# Patient Record
Sex: Male | Born: 1980 | Race: White | Hispanic: No | Marital: Single | Smoking: Current every day smoker
Health system: Southern US, Community
[De-identification: ages and names within clinical notes are randomized; demographics above are authoritative.]

## PROBLEM LIST (undated history)

## (undated) DIAGNOSIS — K219 Gastro-esophageal reflux disease without esophagitis: Secondary | ICD-10-CM

## (undated) DIAGNOSIS — Z9889 Other specified postprocedural states: Secondary | ICD-10-CM

## (undated) DIAGNOSIS — M545 Low back pain, unspecified: Secondary | ICD-10-CM

## (undated) DIAGNOSIS — G8929 Other chronic pain: Secondary | ICD-10-CM

## (undated) DIAGNOSIS — Z87442 Personal history of urinary calculi: Secondary | ICD-10-CM

## (undated) DIAGNOSIS — M1990 Unspecified osteoarthritis, unspecified site: Secondary | ICD-10-CM

## (undated) DIAGNOSIS — Z8489 Family history of other specified conditions: Secondary | ICD-10-CM

## (undated) DIAGNOSIS — R112 Nausea with vomiting, unspecified: Secondary | ICD-10-CM

## (undated) DIAGNOSIS — R1310 Dysphagia, unspecified: Secondary | ICD-10-CM

## (undated) HISTORY — PX: BACK SURGERY: SHX140

## (undated) HISTORY — PX: FRACTURE SURGERY: SHX138

## (undated) HISTORY — PX: ORIF RADIAL FRACTURE: SHX5113

---

## 2008-05-02 ENCOUNTER — Emergency Department (HOSPITAL_COMMUNITY): Admission: EM | Admit: 2008-05-02 | Discharge: 2008-05-02 | Payer: Self-pay | Admitting: Emergency Medicine

## 2009-06-16 ENCOUNTER — Emergency Department (HOSPITAL_COMMUNITY): Admission: EM | Admit: 2009-06-16 | Discharge: 2009-06-16 | Payer: Self-pay | Admitting: Emergency Medicine

## 2011-06-03 ENCOUNTER — Emergency Department (HOSPITAL_COMMUNITY)
Admission: EM | Admit: 2011-06-03 | Discharge: 2011-06-03 | Disposition: A | Discharge status: Home / Self Care | Payer: Medicare Other | Attending: Emergency Medicine | Admitting: Emergency Medicine

## 2011-06-03 ENCOUNTER — Encounter (HOSPITAL_COMMUNITY): Payer: Self-pay

## 2011-06-03 ENCOUNTER — Emergency Department (HOSPITAL_COMMUNITY): Payer: Medicare Other

## 2011-06-03 DIAGNOSIS — M5430 Sciatica, unspecified side: Secondary | ICD-10-CM | POA: Insufficient documentation

## 2011-06-03 DIAGNOSIS — M533 Sacrococcygeal disorders, not elsewhere classified: Secondary | ICD-10-CM | POA: Insufficient documentation

## 2011-06-03 DIAGNOSIS — M545 Low back pain, unspecified: Secondary | ICD-10-CM | POA: Insufficient documentation

## 2011-06-03 DIAGNOSIS — M79609 Pain in unspecified limb: Secondary | ICD-10-CM | POA: Insufficient documentation

## 2011-06-03 MED ORDER — OXYCODONE-ACETAMINOPHEN 5-325 MG PO TABS
1.0000 | ORAL_TABLET | Freq: Once | ORAL | Status: AC
  Administered 2011-06-03: 1 via ORAL
  Filled 2011-06-03: qty 1

## 2011-06-03 MED ORDER — METHOCARBAMOL 500 MG PO TABS
1000.0000 mg | ORAL_TABLET | Freq: Once | ORAL | Status: AC
  Administered 2011-06-03: 1000 mg via ORAL
  Filled 2011-06-03: qty 2

## 2011-06-03 MED ORDER — OXYCODONE-ACETAMINOPHEN 5-325 MG PO TABS: 1.0000 | ORAL_TABLET | ORAL | Status: AC | PRN

## 2011-06-03 MED ORDER — NAPROXEN 500 MG PO TABS: 500.0000 mg | ORAL_TABLET | Freq: Two times a day (BID) | ORAL | Status: AC

## 2011-06-03 MED ORDER — METHOCARBAMOL 500 MG PO TABS: ORAL_TABLET | ORAL | Status: DC

## 2011-06-03 NOTE — Discharge Instructions (Signed)
Sciatica Sciatica is a name for lower back pain caused by pressure on the sciatic nerve. The back pain can spread to the buttocks and back of the leg. HOME CARE   Rest as much as possible.   Only take medicine as told by your doctor.   Apply cold or heat to your back as told by your doctor.   Do not bend, lift, or sit for a long time until your pain is better.   Do not do anything that makes the condition worse.   Start normal activity when the pain is better.   Keep all follow-up visits.  GET HELP RIGHT AWAY IF:   There is more pain.   There is weakness or numbness in the legs.   You cannot control when you poop (bowel movement) or pee (urinate).  MAKE SURE YOU:   Understand these instructions.   Will watch this condition.   Will get help right away if you are not doing well or get worse.  Document Released: 01/09/2008 Document Revised: 12/12/2010 Document Reviewed: 01/09/2008 [employer] Patient Information 2012 [employer]. 

## 2011-06-03 NOTE — ED Provider Notes (Signed)
History     CSN: [id number]  Arrival date & time 06/03/11  0920   First MD Initiated Contact with Patient 06/03/11 0932      Chief Complaint  Patient presents with  . Back Pain    (Consider location/radiation/quality/duration/timing/severity/associated sxs/prior treatment) HPI Comments: Patient complains of right low back pain for 3 weeks. He states pain is increasing and now radiating into his right thigh.  Pain is worse with bending, twisting, or sitting  and improves with rest.  He states that he has had similar symptoms in the past and was told he had" pinched nerve".  He denies dysuria, incontinence of urine or feces, numbness, or weakness of the lower extremity.  Patient is a 31 y.o. male presenting with back pain. The history is provided by the patient. No language interpreter was used.  Back Pain  This is a new problem. The problem occurs constantly. The problem has not changed since onset.The pain is associated with lifting heavy objects and twisting. The pain is present in the sacro-iliac joint and lumbar spine. The quality of the pain is described as aching and shooting. The pain radiates to the right thigh. The pain is moderate. The symptoms are aggravated by bending, twisting and certain positions. The pain is the same all the time. Associated symptoms include leg pain. Pertinent negatives include no chest pain, no fever, no numbness, no abdominal pain, no abdominal swelling, no bowel incontinence, no perianal numbness, no bladder incontinence, no dysuria, no pelvic pain, no paresthesias, no paresis, no tingling and no weakness. He has tried NSAIDs and heat for the symptoms. The treatment provided mild relief.    Past Medical History  Diagnosis Date  . Back pain     Past Surgical History  Procedure Date  . Arm surgery     History reviewed. No pertinent family history.  History  Substance Use Topics  . Smoking status: Current Everyday Smoker  . Smokeless tobacco: Not  on file  . Alcohol Use: No      Review of Systems  Constitutional: Negative for fever, activity change and appetite change.  Cardiovascular: Negative for chest pain.  Gastrointestinal: Negative for abdominal pain and bowel incontinence.  Genitourinary: Negative for bladder incontinence, dysuria and pelvic pain.  Musculoskeletal: Positive for back pain.  Neurological: Negative for tingling, weakness, numbness and paresthesias.  All other systems reviewed and are negative.    Allergies  Codeine  Home Medications  No current outpatient prescriptions on file.  BP 152/90  Pulse 66  Temp(Src) 97.7 F (36.5 C) (Oral)  Resp 18  Ht 6' (1.829 m)  Wt 210 lb (95.255 kg)  BMI 28.48 kg/m2  SpO2 100%  Physical Exam  Nursing note and vitals reviewed. Constitutional: He is oriented to person, place, and time. He appears well-developed and well-nourished. No distress.  HENT:  Head: Normocephalic and atraumatic.  Neck: Normal range of motion. Neck supple.  Cardiovascular: Normal rate, regular rhythm and normal heart sounds.   Pulmonary/Chest: Effort normal and breath sounds normal. No respiratory distress.  Musculoskeletal: Normal range of motion. He exhibits tenderness. He exhibits no edema.       Lumbar back: He exhibits tenderness and bony tenderness. He exhibits normal range of motion, no swelling, no edema, no deformity, no laceration and normal pulse.       Back:  Lymphadenopathy:    He has no cervical adenopathy.  Neurological: He is alert and oriented to person, place, and time. No cranial nerve deficit or  sensory deficit. He exhibits normal muscle tone. Coordination normal.  Reflex Scores:      Patellar reflexes are 2+ on the right side and 2+ on the left side.      Achilles reflexes are 2+ on the right side and 2+ on the left side. Skin: Skin is warm and dry.    ED Course  Procedures (including critical care time)       MDM    Patient has tenderness to the  right lumbar paraspinal muscles and right SI joint.  No focal neuro deficits on exam.  No paresthesias.   He has full range of motion of bilateral lower extremities. He ambulates with a steady gait.  Symptoms are likely related to sciatica.  He agrees to close followup with Dr. Romeo Apple if the pain is not improving.  Patient / Family / Caregiver understand and agree with initial ED impression and plan with expectations set for ED visit. Pt stable in ED with no significant deterioration in condition.      Ailea Rhatigan L. [place], [place] 06/05/11 2159

## 2011-06-03 NOTE — ED Notes (Signed)
Pt reports history of back pain.  For the past 3 weeks has had lower back pain radiating down R leg.

## 2011-06-05 NOTE — ED Provider Notes (Signed)
Medical screening examination/treatment/procedure(s) were performed by non-physician practitioner and as supervising physician I was immediately available for consultation/collaboration.  Nicholes Stairs, MD 06/05/11 [phone]

## 2011-06-06 NOTE — ED Provider Notes (Signed)
Medical screening examination/treatment/procedure(s) were performed by non-physician practitioner and as supervising physician I was immediately available for consultation/collaboration.  Laymond Postle P Ngozi Alvidrez, MD 06/06/11 0711 

## 2012-11-03 ENCOUNTER — Encounter (HOSPITAL_COMMUNITY): Payer: Self-pay

## 2012-11-03 ENCOUNTER — Emergency Department (HOSPITAL_COMMUNITY): Payer: Medicare Other

## 2012-11-03 ENCOUNTER — Emergency Department (HOSPITAL_COMMUNITY)
Admission: EM | Admit: 2012-11-03 | Discharge: 2012-11-03 | Disposition: A | Discharge status: Home / Self Care | Payer: Medicare Other | Attending: Emergency Medicine | Admitting: Emergency Medicine

## 2012-11-03 DIAGNOSIS — R109 Unspecified abdominal pain: Secondary | ICD-10-CM | POA: Insufficient documentation

## 2012-11-03 DIAGNOSIS — R300 Dysuria: Secondary | ICD-10-CM | POA: Insufficient documentation

## 2012-11-03 DIAGNOSIS — F17200 Nicotine dependence, unspecified, uncomplicated: Secondary | ICD-10-CM | POA: Insufficient documentation

## 2012-11-03 DIAGNOSIS — R269 Unspecified abnormalities of gait and mobility: Secondary | ICD-10-CM | POA: Insufficient documentation

## 2012-11-03 DIAGNOSIS — M545 Low back pain, unspecified: Secondary | ICD-10-CM | POA: Insufficient documentation

## 2012-11-03 DIAGNOSIS — M549 Dorsalgia, unspecified: Secondary | ICD-10-CM

## 2012-11-03 LAB — URINALYSIS, ROUTINE W REFLEX MICROSCOPIC
Bilirubin Urine: NEGATIVE
Glucose, UA: NEGATIVE mg/dL
Hgb urine dipstick: NEGATIVE
Ketones, ur: NEGATIVE mg/dL
Protein, ur: NEGATIVE mg/dL
Urobilinogen, UA: 0.2 mg/dL (ref 0.0–1.0)

## 2012-11-03 MED ORDER — ONDANSETRON 8 MG PO TBDP
8.0000 mg | ORAL_TABLET | Freq: Once | ORAL | Status: AC
  Administered 2012-11-03: 8 mg via ORAL
  Filled 2012-11-03: qty 1

## 2012-11-03 MED ORDER — OXYCODONE-ACETAMINOPHEN 5-325 MG PO TABS
2.0000 | ORAL_TABLET | Freq: Once | ORAL | Status: AC
  Administered 2012-11-03: 2 via ORAL
  Filled 2012-11-03: qty 2

## 2012-11-03 MED ORDER — CYCLOBENZAPRINE HCL 10 MG PO TABS: 10.0000 mg | ORAL_TABLET | Freq: Three times a day (TID) | ORAL | Status: DC | PRN

## 2012-11-03 MED ORDER — OXYCODONE-ACETAMINOPHEN 5-325 MG PO TABS: 1.0000 | ORAL_TABLET | ORAL | Status: DC | PRN

## 2012-11-03 MED ORDER — NAPROXEN 500 MG PO TABS: 500.0000 mg | ORAL_TABLET | Freq: Two times a day (BID) | ORAL | Status: DC

## 2012-11-03 NOTE — ED Provider Notes (Signed)
History    CSN: [id number] Arrival date & time 11/03/12  4098  First MD Initiated Contact with Patient 11/03/12 1908     No chief complaint on file.  (Consider location/radiation/quality/duration/timing/severity/associated sxs/prior Treatment) HPI Comments: Manuel Allen is a 32 y.o. male who presents to the Emergency Department complaining of bilateral flank and low back pain for 3 days.  States the pain began in the right flank and radiates into the groin and today, noticed the pain began in the left flank as well.  Patient reports hx of kidney stones and states the pain feels similar to previous kidney stones.  He also reports urinary hesitancy today.  He does states the pain is worse with standing and improves when supine.  He denies vomiting, fever, testicle or scrotal pain or swelling, penile discharge.  He also denies numbness of the LE's, pain to his legs, or incontinence of bladder or bowel   The history is provided by the patient.   Past Medical History  Diagnosis Date  . Back pain    Past Surgical History  Procedure Laterality Date  . Arm surgery     No family history on file. History  Substance Use Topics  . Smoking status: Current Every Day Smoker  . Smokeless tobacco: Not on file  . Alcohol Use: No    Review of Systems  Constitutional: Negative for fever.  Respiratory: Negative for shortness of breath.   Cardiovascular: Negative for chest pain.  Gastrointestinal: Negative for vomiting, abdominal pain, constipation and abdominal distention.  Genitourinary: Positive for dysuria, flank pain and difficulty urinating. Negative for frequency, hematuria, decreased urine volume, discharge, penile swelling, scrotal swelling, penile pain and testicular pain.       No perineal numbness or incontinence of urine or feces  Musculoskeletal: Positive for back pain and gait problem. Negative for joint swelling.  Skin: Negative for rash.  Neurological: Negative for weakness and  numbness.  All other systems reviewed and are negative.    Allergies  Codeine  Home Medications  No current outpatient prescriptions on file. BP 176/98  Pulse 82  Temp(Src) 97.7 F (36.5 C)  Resp 17  Ht 6' (1.829 m)  Wt 220 lb (99.791 kg)  BMI 29.83 kg/m2  SpO2 98% Physical Exam  Nursing note and vitals reviewed. Constitutional: He is oriented to person, place, and time. He appears well-developed and well-nourished. No distress.  HENT:  Head: Normocephalic and atraumatic.  Neck: Normal range of motion. Neck supple.  Cardiovascular: Normal rate, regular rhythm, normal heart sounds and intact distal pulses.   No murmur heard. Pulmonary/Chest: Effort normal and breath sounds normal. No respiratory distress. He exhibits no tenderness.  Abdominal: Soft. He exhibits no distension. There is no tenderness. There is no rebound, no guarding and no CVA tenderness.  Musculoskeletal: He exhibits tenderness. He exhibits no edema.       Lumbar back: He exhibits tenderness and pain. He exhibits normal range of motion, no swelling, no deformity, no laceration and normal pulse.  ttp of the bilateral lumbar paraspinal muscles.  No spinal tenderness.  DP pulses are brisk and symmetrical.  Distal sensation intact.  Hip Flexors/Extensors are intact  Neurological: He is alert and oriented to person, place, and time. No cranial nerve deficit or sensory deficit. He exhibits normal muscle tone. Coordination and gait normal.  Reflex Scores:      Patellar reflexes are 2+ on the right side and 2+ on the left side.  Achilles reflexes are 2+ on the right side and 2+ on the left side. Skin: Skin is warm and dry.    ED Course  Procedures (including critical care time) Results for orders placed during the hospital encounter of 11/03/12  URINALYSIS, ROUTINE W REFLEX MICROSCOPIC      Result Value Range   Color, Urine YELLOW  YELLOW   APPearance CLEAR  CLEAR   Specific Gravity, Urine 1.020  1.005 -  1.030   pH 5.5  5.0 - 8.0   Glucose, UA NEGATIVE  NEGATIVE mg/dL   Hgb urine dipstick NEGATIVE  NEGATIVE   Bilirubin Urine NEGATIVE  NEGATIVE   Ketones, ur NEGATIVE  NEGATIVE mg/dL   Protein, ur NEGATIVE  NEGATIVE mg/dL   Urobilinogen, UA 0.2  0.0 - 1.0 mg/dL   Nitrite NEGATIVE  NEGATIVE   Leukocytes, UA NEGATIVE  NEGATIVE     Ct Abdomen Pelvis Wo Contrast  11/03/2012   *RADIOLOGY REPORT*  Clinical Data: Bilateral flank pain and history of renal calculi.  CT ABDOMEN AND PELVIS WITHOUT CONTRAST  Technique:  Multidetector CT imaging of the abdomen and pelvis was performed following the standard protocol without intravenous contrast.  Comparison: Prior study at [facility] dated 10/07/2005.  Findings: The kidneys are normal in appearance and show no evidence of calculi or obstruction.  The ureters and bladder are within normal limits.  Unenhanced appearance of the liver, gallbladder, pancreas, spleen, adrenal glands and bowel are unremarkable.  No abnormal fluid collections are seen.  No evidence of mass or enlarged lymph nodes.  There is suggestion of a minimal right inguinal hernia containing fat.  Bony structures are unremarkable and show minimal osteophyte formation in the thoracic and lumbar spines.  IMPRESSION: No acute findings.   Original Report Authenticated By: Irish Lack, M.D.     MDM     Patient is feeling better, VSS.  Non-toxic appearing.  Abd is soft, no significant tenderness, guarding or rebound tenderness.  Ambulates with a steady gait.  No focal neuro deficits on exam.  I have advised him to apply ice/heat to his back, f/u with his PMD, or to return here for worsening symptoms such as abd pain, fever, vomiting or hematuria.    He appears stable for discharge.  Kahliya Fraleigh L. Trisha Mangle, PA-C 11/03/12 2121

## 2012-11-03 NOTE — ED Notes (Signed)
Bil flank pain , nausea, dysuria, hx of kidney stones

## 2012-11-03 NOTE — ED Notes (Signed)
Pt reports left and right side pain for the past 3 days.  Pt denies any injury.  Pt reports the pain worsening with movement.  Pt denies any sob.

## 2012-11-04 NOTE — ED Provider Notes (Signed)
Medical screening examination/treatment/procedure(s) were performed by non-physician practitioner and as supervising physician I was immediately available for consultation/collaboration. Devoria Albe, MD, FACEP   Ward Givens, MD 11/04/12 [phone]

## 2013-07-26 ENCOUNTER — Encounter (HOSPITAL_COMMUNITY): Payer: Self-pay | Admitting: Emergency Medicine

## 2013-07-26 ENCOUNTER — Emergency Department (HOSPITAL_COMMUNITY): Payer: Medicare Other

## 2013-07-26 ENCOUNTER — Emergency Department (HOSPITAL_COMMUNITY)
Admission: EM | Admit: 2013-07-26 | Discharge: 2013-07-26 | Disposition: A | Discharge status: Home / Self Care | Payer: Medicare Other | Attending: Emergency Medicine | Admitting: Emergency Medicine

## 2013-07-26 DIAGNOSIS — Y92009 Unspecified place in unspecified non-institutional (private) residence as the place of occurrence of the external cause: Secondary | ICD-10-CM | POA: Insufficient documentation

## 2013-07-26 DIAGNOSIS — S40019A Contusion of unspecified shoulder, initial encounter: Secondary | ICD-10-CM | POA: Insufficient documentation

## 2013-07-26 DIAGNOSIS — Z9889 Other specified postprocedural states: Secondary | ICD-10-CM | POA: Insufficient documentation

## 2013-07-26 DIAGNOSIS — S161XXA Strain of muscle, fascia and tendon at neck level, initial encounter: Secondary | ICD-10-CM

## 2013-07-26 DIAGNOSIS — Y939 Activity, unspecified: Secondary | ICD-10-CM | POA: Insufficient documentation

## 2013-07-26 DIAGNOSIS — F17200 Nicotine dependence, unspecified, uncomplicated: Secondary | ICD-10-CM | POA: Insufficient documentation

## 2013-07-26 DIAGNOSIS — S139XXA Sprain of joints and ligaments of unspecified parts of neck, initial encounter: Secondary | ICD-10-CM | POA: Insufficient documentation

## 2013-07-26 DIAGNOSIS — W010XXA Fall on same level from slipping, tripping and stumbling without subsequent striking against object, initial encounter: Secondary | ICD-10-CM | POA: Insufficient documentation

## 2013-07-26 MED ORDER — IBUPROFEN 600 MG PO TABS: 600.0000 mg | ORAL_TABLET | Freq: Four times a day (QID) | ORAL | Status: DC | PRN

## 2013-07-26 MED ORDER — OXYCODONE-ACETAMINOPHEN 5-325 MG PO TABS
1.0000 | ORAL_TABLET | Freq: Once | ORAL | Status: AC
  Administered 2013-07-26: 1 via ORAL
  Filled 2013-07-26: qty 1

## 2013-07-26 MED ORDER — OXYCODONE-ACETAMINOPHEN 5-325 MG PO TABS: 1.0000 | ORAL_TABLET | ORAL | Status: DC | PRN

## 2013-07-26 NOTE — ED Notes (Signed)
Tripped on garden hose today./  C/o pain to left shoulder

## 2013-07-26 NOTE — ED Notes (Signed)
Lt shoulder pain after tripped over a water hose.

## 2013-07-26 NOTE — ED Provider Notes (Signed)
CSN: [id number]     Arrival date & time 07/26/13  1704 History   This chart was scribed for Manuel Allen Jaray Boliver by Ladona Ridgelaylor Day, ED scribe. This patient was seen in room APFT20/APFT20 and the patient's care was started at 1704.  Chief Complaint  Patient presents with  . Shoulder Injury   Patient is a 33 y.o. male presenting with shoulder injury. The history is provided by the patient. No language interpreter was used.  Shoulder Injury This is a new problem. The current episode started 3 to 5 hours ago. The problem has not changed since onset.Pertinent negatives include no chest pain, no abdominal pain and no shortness of breath. The symptoms are aggravated by bending. Nothing relieves the symptoms. He has tried nothing for the symptoms.   HPI Comments: Manuel Allen is a 33 y.o. male who presents to the Emergency Department for sudden onset, constant, left shoulder pain after he tripped on a garden hose and fell on his left side onto gravel; today about 2 1/2 hours ago at home and had sudden onset of shoulder pain; no LOC, unsure if he hit his head. He reports that pain is gone at rest and worsened by movement and palpation.   He has not taken any medicines at home prior to arrival. He denies weakness or numbness in his upper extremities, also denies dizziness, nausea, vomiting, headache.  He does have pain in his neck also since his fall.  His PCP is Dr. Janna Archondiego  Past Medical History  Diagnosis Date  . Back pain    Past Surgical History  Procedure Laterality Date  . Arm surgery     History reviewed. No pertinent family history. History  Substance Use Topics  . Smoking status: Current Every Day Smoker  . Smokeless tobacco: Not on file  . Alcohol Use: No    Review of Systems  Constitutional: Negative for fever and chills.  Respiratory: Negative for cough and shortness of breath.   Cardiovascular: Negative for chest pain.  Gastrointestinal: Negative for abdominal pain.  Musculoskeletal:  Negative for back pain.       Left shoulder pain  All other systems reviewed and are negative.   Allergies  Codeine  Home Medications   Current Outpatient Rx  Name  Route  Sig  Dispense  Refill  . ibuprofen (ADVIL,MOTRIN) 600 MG tablet   Oral   Take 1 tablet (600 mg total) by mouth every 6 (six) hours as needed.   30 tablet   0   . oxyCODONE-acetaminophen (PERCOCET/ROXICET) 5-325 MG per tablet   Oral   Take 1 tablet by mouth every 4 (four) hours as needed for severe pain.   15 tablet   0    Triage Vitals: BP 158/90  Pulse 90  Temp(Src) 98 F (36.7 C) (Oral)  Resp 18  Ht 6' (1.829 m)  Wt 220 lb (99.791 kg)  BMI 29.83 kg/m2  SpO2 100%  Physical Exam  Nursing note and vitals reviewed. Constitutional: He appears well-developed and well-nourished.  HENT:  Head: Normocephalic and atraumatic.  No hematoma  Eyes:  Disconjugate gaze in left eye which he states is normal.  Neck: Normal range of motion.  Cardiovascular:  Pulses equal bilaterally  Musculoskeletal: He exhibits tenderness.   Full radial pulse. Distal sensation intact.  Equal grip strength.   Normal light touch sensation.Tender to palpation lateral left humeral head. Left clavicle w/no obvious deformity and nontender. He also has point tenderness at c6 c7 midline. Distal LUE  sensation is normal. He can flex and extend his left wrist w/out pain.   Neurological: He is alert. He has normal strength. He displays normal reflexes. No sensory deficit.  Skin: Skin is warm and dry.  Psychiatric: He has a normal mood and affect.   ED Course  Procedures (including critical care time) DIAGNOSTIC STUDIES: Oxygen Saturation is 100% on room air, normal by my interpretation.    COORDINATION OF CARE: At 545 PM Discussed treatment plan with patient which includes left shoulder X-ray, pain medicine, apply ice. Patient agrees.   Labs Review Labs Reviewed - No data to display Imaging Review Dg Cervical Spine  Complete  07/26/2013   CLINICAL DATA:  Fall.  Posterior neck pain and left shoulder pain.  EXAM: CERVICAL SPINE  4+ VIEWS  COMPARISON:  None.  FINDINGS: Mild spurring anterior to the vertebral body column at C5-6 and C6-7. No abnormal subluxation observed. The spinous process of C7 is obscured by the patient's shoulders on the lateral projections, despite the swimmer's view. No prevertebral soft tissue swelling.  IMPRESSION: 1. No cervical spine fracture or acute subluxation is identified.   Electronically Signed   By: Herbie BaltimoreWalt  Liebkemann M.D.   On: 07/26/2013 18:31   Ct Cervical Spine Wo Contrast  07/26/2013   CLINICAL DATA:  Posterior lower neck pain after a fall today.  EXAM: CT CERVICAL SPINE WITHOUT CONTRAST  TECHNIQUE: Multidetector CT imaging of the cervical spine was performed without intravenous contrast. Multiplanar CT image reconstructions were also generated.  COMPARISON:  Radiographs dated 07/26/2013  FINDINGS: There is no fracture, subluxation, prevertebral soft tissue swelling, disc space narrowing, facet arthritis, or other abnormality.  IMPRESSION: Normal exam.   Electronically Signed   By: Geanie CooleyJim  Maxwell M.D.   On: 07/26/2013 20:30   Dg Shoulder Left  07/26/2013   CLINICAL DATA:  Fall, left shoulder pain  EXAM: LEFT SHOULDER - 2+ VIEW  COMPARISON:  None.  FINDINGS: Three views of left shoulder submitted. No acute fracture or subluxation. No radiopaque foreign body.  IMPRESSION: Negative.   Electronically Signed   By: Natasha MeadLiviu  Pop M.D.   On: 07/26/2013 17:49     EKG Interpretation None      MDM   Final diagnoses:  Shoulder contusion  Cervical strain    Patients labs and/or radiological studies were viewed and considered during the medical decision making and disposition process. Pt with fall and painful injury to left shoulder and lower c spine without xray or Ct findings of bony trauma.  He was prescribed ibuprofen,  Oxycodone.  Encouraged ice packs, can add heat tx in 2 days.   Sling provided for additional pain relief.  Encouraged f/u by pcp in 1 week if not improving. I personally performed the services described in this documentation, which was scribed in my presence. The recorded information has been reviewed and is accurate.      Manuel Allen Yosiel Thieme, PA-C 07/27/13 [phone]

## 2013-07-26 NOTE — Discharge Instructions (Signed)
Contusion A contusion is a deep bruise. Contusions happen when an injury causes bleeding under the skin. Signs of bruising include pain, puffiness (swelling), and discolored skin. The contusion may turn blue, purple, or yellow. HOME CARE   Put ice on the injured area.  Put ice in a plastic bag.  Place a towel between your skin and the bag.  Leave the ice on for 15-20 minutes, 03-04 times a day, apply ice for the next 2 days. You may add a heating pad to your areas of injury for 20 minutes several times daily starting in 3 days..  Only take medicine as told by your doctor.  Rest the injured area.  If possible, raise (elevate) the injured area to lessen puffiness. GET HELP RIGHT AWAY IF:   You have more bruising or puffiness.  You have pain that is getting worse.  Your puffiness or pain is not helped by medicine. MAKE SURE YOU:   Understand these instructions.  Will watch your condition.  Will get help right away if you are not doing well or get worse. Document Released: 09/18/2007 Document Revised: 06/24/2011 Document Reviewed: 02/04/2011 [facility] Patient Information 2014 [place], [place].  You may take the oxycodone prescribed for pain relief.  This will make you drowsy - do not drive within 4 hours of taking this medication.

## 2013-07-27 NOTE — ED Provider Notes (Signed)
Medical screening examination/treatment/procedure(s) were performed by non-physician practitioner and as supervising physician I was immediately available for consultation/collaboration.   EKG Interpretation None        Armella Stogner, MD 07/27/13 1534 

## 2013-09-28 ENCOUNTER — Emergency Department (HOSPITAL_COMMUNITY)
Admission: EM | Admit: 2013-09-28 | Discharge: 2013-09-28 | Disposition: A | Discharge status: Home / Self Care | Payer: Medicare Other | Attending: Emergency Medicine | Admitting: Emergency Medicine

## 2013-09-28 ENCOUNTER — Encounter (HOSPITAL_COMMUNITY): Payer: Self-pay | Admitting: Emergency Medicine

## 2013-09-28 ENCOUNTER — Emergency Department (HOSPITAL_COMMUNITY): Payer: Medicare Other

## 2013-09-28 DIAGNOSIS — Y9389 Activity, other specified: Secondary | ICD-10-CM | POA: Diagnosis not present

## 2013-09-28 DIAGNOSIS — X500XXA Overexertion from strenuous movement or load, initial encounter: Secondary | ICD-10-CM | POA: Insufficient documentation

## 2013-09-28 DIAGNOSIS — F17200 Nicotine dependence, unspecified, uncomplicated: Secondary | ICD-10-CM | POA: Insufficient documentation

## 2013-09-28 DIAGNOSIS — Y929 Unspecified place or not applicable: Secondary | ICD-10-CM | POA: Diagnosis not present

## 2013-09-28 DIAGNOSIS — Z9889 Other specified postprocedural states: Secondary | ICD-10-CM | POA: Diagnosis not present

## 2013-09-28 DIAGNOSIS — S63509A Unspecified sprain of unspecified wrist, initial encounter: Secondary | ICD-10-CM | POA: Diagnosis not present

## 2013-09-28 DIAGNOSIS — S59909A Unspecified injury of unspecified elbow, initial encounter: Secondary | ICD-10-CM | POA: Diagnosis present

## 2013-09-28 DIAGNOSIS — S63502A Unspecified sprain of left wrist, initial encounter: Secondary | ICD-10-CM

## 2013-09-28 MED ORDER — NAPROXEN 500 MG PO TABS: 500.0000 mg | ORAL_TABLET | Freq: Two times a day (BID) | ORAL | Status: DC

## 2013-09-28 MED ORDER — HYDROCODONE-ACETAMINOPHEN 5-325 MG PO TABS: ORAL_TABLET | ORAL | Status: DC

## 2013-09-28 NOTE — ED Notes (Signed)
Pt states twisted left wrist while picking up lawn mower. Slight swelling noted. Radial pulses present. rom limited due to pain

## 2013-09-28 NOTE — Discharge Instructions (Signed)
Wrist Pain Wrist injuries are frequent in adults and children. A sprain is an injury to the ligaments that hold your bones together. A strain is an injury to muscle or muscle cord-like structures (tendons) from stretching or pulling. Generally, when wrists are moderately tender to touch following a fall or injury, a break in the bone (fracture) may be present. Most wrist sprains or strains are better in 3 to 5 days, but complete healing may take several weeks. HOME CARE INSTRUCTIONS   Put ice on the injured area.  Put ice in a plastic bag.  Place a towel between your skin and the bag.  Leave the ice on for 15-20 minutes, 03-04 times a day, for the first 2 days.  Keep your arm raised above the level of your heart whenever possible to reduce swelling and pain.  Rest the injured area for at least 48 hours or as directed by your caregiver.  If a splint or elastic bandage has been applied, use it for as long as directed by your caregiver or until seen by a caregiver for a follow-up exam.  Only take over-the-counter or prescription medicines for pain, discomfort, or fever as directed by your caregiver.  Keep all follow-up appointments. You may need to follow up with a specialist or have follow-up X-rays. Improvement in pain level is not a guarantee that you did not fracture a bone in your wrist. The only way to determine whether or not you have a broken bone is by X-ray. SEEK IMMEDIATE MEDICAL CARE IF:   Your fingers are swollen, very red, white, or cold and blue.  Your fingers are numb or tingling.  You have increasing pain.  You have difficulty moving your fingers. MAKE SURE YOU:   Understand these instructions.  Will watch your condition.  Will get help right away if you are not doing well or get worse. Document Released: 01/09/2005 Document Revised: 06/24/2011 Document Reviewed: 05/23/2010 [employer] Patient Information 2014 [employer].  

## 2013-09-28 NOTE — ED Notes (Signed)
Pt verbalized understanding of no driving and to use caution with taking norco due to med causes drowsiness

## 2013-09-30 NOTE — ED Provider Notes (Signed)
CSN: [id number]     Arrival date & time 09/28/13  1138 History   First MD Initiated Contact with Patient 09/28/13 1211     Chief Complaint  Patient presents with  . Wrist Pain     (Consider location/radiation/quality/duration/timing/severity/associated sxs/prior Treatment) Patient is a 33 y.o. male presenting with wrist pain. The history is provided by the patient.  Wrist Pain This is a new problem. The current episode started today. The problem occurs constantly. The problem has been unchanged. Associated symptoms include arthralgias and joint swelling. Pertinent negatives include no chills, fever, nausea, neck pain, numbness, rash, vomiting or weakness. The symptoms are aggravated by twisting and bending. He has tried nothing for the symptoms. The treatment provided no relief.   Pt reports pain to left wrist after picking up a lawn mower.  States pain is worse with flexion of the wrist.  Improves at rest.  He also reports some swelling.  He denies fall, rash, fever, chills, or numbness of the hand or fingers.  Past Medical History  Diagnosis Date  . Back pain    Past Surgical History  Procedure Laterality Date  . Arm surgery     History reviewed. No pertinent family history. History  Substance Use Topics  . Smoking status: Current Every Day Smoker  . Smokeless tobacco: Not on file  . Alcohol Use: No    Review of Systems  Constitutional: Negative for fever and chills.  Gastrointestinal: Negative for nausea and vomiting.  Genitourinary: Negative for dysuria and difficulty urinating.  Musculoskeletal: Positive for arthralgias and joint swelling. Negative for neck pain.  Skin: Negative for color change, rash and wound.  Neurological: Negative for weakness and numbness.  All other systems reviewed and are negative.     Allergies  Codeine  Home Medications   Prior to Admission medications   Medication Sig Start Date End Date Taking? Authorizing Provider   HYDROcodone-acetaminophen (NORCO/VICODIN) 5-325 MG per tablet Take one-two tabs po q 4-6 hrs prn pain 09/28/13   Tammy L. Triplett, PA-C  naproxen (NAPROSYN) 500 MG tablet Take 1 tablet (500 mg total) by mouth 2 (two) times daily. 09/28/13   Tammy L. Triplett, PA-C   BP 132/83  Pulse 85  Temp(Src) 97.1 F (36.2 C) (Oral)  Resp 18  Ht 6' (1.829 m)  Wt 240 lb (108.863 kg)  BMI 32.54 kg/m2  SpO2 98% Physical Exam  Nursing note and vitals reviewed. Constitutional: He is oriented to person, place, and time. He appears well-developed and well-nourished. No distress.  HENT:  Head: Normocephalic and atraumatic.  Cardiovascular: Normal rate, regular rhythm and normal heart sounds.   No murmur heard. Pulmonary/Chest: Effort normal and breath sounds normal. No respiratory distress.  Musculoskeletal: He exhibits tenderness. He exhibits no edema.  left wrist is ttp dorsal aspect.  Radial pulse is brisk, distal sensation intact.  CR< 2 sec.  No bruising or bony deformity.  Patient has full ROM, but pain reproduced with flexion. Compartments of left arm soft.  Neurological: He is alert and oriented to person, place, and time. He exhibits normal muscle tone. Coordination normal.  Skin: Skin is warm and dry.    ED Course  Procedures (including critical care time) Labs Review Labs Reviewed - No data to display  Imaging Review Dg Wrist Complete Left  09/28/2013   CLINICAL DATA:  Pain post trauma  EXAM: LEFT WRIST - COMPLETE 3+ VIEW  COMPARISON:  None.  FINDINGS: Frontal, oblique, lateral, and ulnar deviation scaphoid images were  obtained. There is postoperative change in the distal radius with remodeling. There is no acute fracture or dislocation. 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 No erosive change.  IMPRESSION: Previous surgery in the distal radius. 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 No acute fracture or dislocation.   Electronically Signed   By:  Bretta BangWilliam  Woodruff M.D.   On: 09/28/2013 12:27    EKG Interpretation None      MDM   Final diagnoses:  Sprain of wrist, left    Pt is well appearing, likely sprain of the wrist.  No concerning sx's for septic joint.  NV intact.  Pt agrees to symptomatic tx with velcro wrist splint, vicodin and naprosyn.  Referral given to ortho in one week if needed.  Pt agrees to plan and stable for d/c     Tammy L. Trisha Mangleriplett, PA-C 09/30/13 2148

## 2013-10-01 NOTE — ED Provider Notes (Signed)
Medical screening examination/treatment/procedure(s) were performed by non-physician practitioner and as supervising physician I was immediately available for consultation/collaboration.   EKG Interpretation None        Joseph L Zammit, MD 10/01/13 1546 

## 2014-01-20 ENCOUNTER — Ambulatory Visit: Payer: Medicare Other | Admitting: Orthopedic Surgery

## 2014-02-10 ENCOUNTER — Telehealth: Payer: Self-pay | Admitting: Orthopedic Surgery

## 2014-02-10 ENCOUNTER — Ambulatory Visit (INDEPENDENT_AMBULATORY_CARE_PROVIDER_SITE_OTHER): Payer: Medicare Other | Admitting: Orthopedic Surgery

## 2014-02-10 ENCOUNTER — Encounter: Payer: Self-pay | Admitting: Orthopedic Surgery

## 2014-02-10 DIAGNOSIS — M25332 Other instability, left wrist: Secondary | ICD-10-CM

## 2014-02-10 DIAGNOSIS — S6980XA Other specified injuries of unspecified wrist, hand and finger(s), initial encounter: Secondary | ICD-10-CM | POA: Insufficient documentation

## 2014-02-10 NOTE — Telephone Encounter (Signed)
Per call back from patient/patient's mother, who is his designated party contact, request to hold on scheduling the MRI at this time, due to secondary insurance issue (Medicaid). States working on this issue with caseworker and with Medicaid, and will call when able to schedule.

## 2014-02-10 NOTE — Progress Notes (Signed)
Patient ID: Manuel MatasBrian L Holzmann, male   DOB: Jan 10, 1981, 33 y.o.   MRN: [id number] Patient ID: Manuel MatasBrian L Rinn, male   DOB: Jan 10, 1981, 33 y.o.   MRN: [id number]  Chief Complaint  Patient presents with  . Wrist Injury    Left wrist pain for 4 months/injury 09/28/2013    HPI Manuel Allen is a 33 y.o. male.  HPI 33 year old male had a distal radius fracture approximately 15 or 16 years ago had open treatment internal fixation with volar plating recovered well except over the last few years he's noticed decreased wrist motion in flexion and extension and pronation supination. However, he has been able to do most things that he wants to do. June of this year he was lifting a lawnmower motor it twisted out of his hand and he felt acute pain over his DRUJ left wrist. He went to the emergency room x-rays were obtained and they showed arthritis of the wrist and a plate with a well-healed distal radius fracture and slight ulnar positive variance to the left wrist. That is my interpretation of the x-ray and I read the report  After 6 weeks in a brace he continues to have pain and popping over the distal radial ulnar joint with pronation and supination. He did take naproxen but over several weeks' time it caused him to have GI upset. He now can't lift over 5 pounds without feeling popping and clicking in the left DRUJ. Past Medical History  Diagnosis Date  . Back pain     Past Surgical History  Procedure Laterality Date  . Arm surgery      Open treatment internal fixation left forearm radius plate    History reviewed. No pertinent family history.  Social History History  Substance Use Topics  . Smoking status: Current Every Day Smoker  . Smokeless tobacco: Not on file  . Alcohol Use: No    Allergies  Allergen Reactions  . Codeine Hives    Current Outpatient Prescriptions  Medication Sig Dispense Refill  . naproxen (NAPROSYN) 500 MG tablet Take 1 tablet (500 mg total) by mouth 2 (two) times  daily.  20 tablet  0  . HYDROcodone-acetaminophen (NORCO/VICODIN) 5-325 MG per tablet Take one-two tabs po q 4-6 hrs prn pain  20 tablet  0   No current facility-administered medications for this visit.    Review of Systems Review of Systems  Allergic/Immunologic: Positive for environmental allergies.    There were no vitals taken for this visit.  Physical Exam Physical Exam  Vitals reviewed. Constitutional: He is oriented to person, place, and time. He appears well-developed and well-nourished.  Cardiovascular:  Radial pulses normal in the left wrist  Lymphadenopathy:       Left: No epitrochlear adenopathy present.  Neurological: He is alert and oriented to person, place, and time. He has normal reflexes.  Skin: Skin is warm and dry.  Surgical incision left wrist volar aspect no complications  Psychiatric: He has a normal mood and affect. His behavior is normal. Judgment and thought content normal.   left wrist the patient has decreased extension near normal flexion normal pronation decreased supination of the left wrist. He has tenderness over the DRUJ as well as the TFCC. His grip strength remains normal. There is no swelling over the wrist joint. He does have some tenderness over the radiocarpal joint. Debility of the wrist joint itself/radiocarpal joint normal. The DRUJ does not reveal any instability but he says it only pops and clicks  when he is lifting over 5 pounds  Data Reviewed As discussed and reviewed the x-ray he has a plate on the radius slight ulnar positive variance  Assessment    DRUJ versus TFCC tear    Plan    MR arthrogram       Fuller CanadaStanley Nicola Quesnell 02/10/2014, 11:13 AM

## 2014-03-03 ENCOUNTER — Ambulatory Visit: Payer: Medicare Other | Admitting: Orthopedic Surgery

## 2014-04-16 ENCOUNTER — Encounter (HOSPITAL_COMMUNITY): Payer: Self-pay | Admitting: Emergency Medicine

## 2014-04-16 ENCOUNTER — Emergency Department (HOSPITAL_COMMUNITY)
Admission: EM | Admit: 2014-04-16 | Discharge: 2014-04-16 | Disposition: A | Discharge status: Home / Self Care | Payer: Medicare Other | Attending: Emergency Medicine | Admitting: Emergency Medicine

## 2014-04-16 DIAGNOSIS — Z23 Encounter for immunization: Secondary | ICD-10-CM | POA: Insufficient documentation

## 2014-04-16 DIAGNOSIS — W5911XA Bitten by nonvenomous snake, initial encounter: Secondary | ICD-10-CM | POA: Diagnosis not present

## 2014-04-16 DIAGNOSIS — S81852A Open bite, left lower leg, initial encounter: Secondary | ICD-10-CM | POA: Insufficient documentation

## 2014-04-16 DIAGNOSIS — Y998 Other external cause status: Secondary | ICD-10-CM | POA: Insufficient documentation

## 2014-04-16 DIAGNOSIS — Y9289 Other specified places as the place of occurrence of the external cause: Secondary | ICD-10-CM | POA: Insufficient documentation

## 2014-04-16 DIAGNOSIS — Z720 Tobacco use: Secondary | ICD-10-CM | POA: Diagnosis not present

## 2014-04-16 DIAGNOSIS — Y9301 Activity, walking, marching and hiking: Secondary | ICD-10-CM | POA: Insufficient documentation

## 2014-04-16 DIAGNOSIS — T63001A Toxic effect of unspecified snake venom, accidental (unintentional), initial encounter: Secondary | ICD-10-CM

## 2014-04-16 MED ORDER — HYDROCODONE-ACETAMINOPHEN 5-325 MG PO TABS
2.0000 | ORAL_TABLET | Freq: Once | ORAL | Status: AC
  Administered 2014-04-16: 2 via ORAL
  Filled 2014-04-16: qty 2

## 2014-04-16 MED ORDER — TETANUS-DIPHTH-ACELL PERTUSSIS 5-2.5-18.5 LF-MCG/0.5 IM SUSP
0.5000 mL | Freq: Once | INTRAMUSCULAR | Status: AC
  Administered 2014-04-16: 0.5 mL via INTRAMUSCULAR
  Filled 2014-04-16: qty 0.5

## 2014-04-16 MED ORDER — HYDROCODONE-ACETAMINOPHEN 5-325 MG PO TABS: 1.0000 | ORAL_TABLET | ORAL | Status: DC | PRN

## 2014-04-16 NOTE — ED Provider Notes (Signed)
TIME SEEN: 7:12 PM  CHIEF COMPLAINT: Possible snake bite  HPI: Pt is a 34 y.o. male with no significant past medical history who presents emergency department with possible snake bite to his left posterior calf. He reports that he was rather hunting approximate 4 hours prior to arrival and states he felt a sharp pain like a bee sting in the back of his leg. He states that he started walking up a hill and began having increasing pain in the leg and saw a black snake slathering away. He denies any swelling to the leg, ecchymosis, erythema or warmth, drainage. He did have some nausea initially but this is resolved. No fevers, vomiting or diarrhea, easy bruising or bleeding. He does have multiple abrasions to his skin which he reports is from being in the woods from trees and briars.  ROS: See HPI Constitutional: no fever  Eyes: no drainage  ENT: no runny nose   Cardiovascular:  no chest pain  Resp: no SOB  GI: no vomiting GU: no dysuria Integumentary: no rash  Allergy: no hives  Musculoskeletal: no leg swelling  Neurological: no slurred speech ROS otherwise negative  PAST MEDICAL HISTORY/PAST SURGICAL HISTORY:  Past Medical History  Diagnosis Date  . Back pain     MEDICATIONS:  Prior to Admission medications   Medication Sig Start Date End Date Taking? Authorizing Provider  HYDROcodone-acetaminophen (NORCO/VICODIN) 5-325 MG per tablet Take one-two tabs po q 4-6 hrs prn pain Patient not taking: Reported on 04/16/2014 09/28/13   Tammy L. Triplett, PA-C  naproxen (NAPROSYN) 500 MG tablet Take 1 tablet (500 mg total) by mouth 2 (two) times daily. Patient not taking: Reported on 04/16/2014 09/28/13   Tammy L. Triplett, PA-C    ALLERGIES:  Allergies  Allergen Reactions  . Codeine Hives    SOCIAL HISTORY:  History  Substance Use Topics  . Smoking status: Current Every Day Smoker -- 1.00 packs/day for 23 years    Types: Cigarettes  . Smokeless tobacco: Current User    Types: Chew  .  Alcohol Use: No    FAMILY HISTORY: Family History  Problem Relation Age of Onset  . Hypertension Mother   . Cancer Other   . Diabetes Other     EXAM: BP 150/73 mmHg  Pulse 109  Temp(Src) 98.1 F (36.7 C) (Oral)  Resp 16  Ht 6' (1.829 m)  Wt 240 lb (108.863 kg)  BMI 32.54 kg/m2  SpO2 100% CONSTITUTIONAL: Alert and oriented and responds appropriately to questions. Well-appearing; well-nourished HEAD: Normocephalic EYES: Conjunctivae clear, PERRL ENT: normal nose; no rhinorrhea; moist mucous membranes; pharynx without lesions noted NECK: Supple, no meningismus, no LAD  CARD: RRR; S1 and S2 appreciated; no murmurs, no clicks, no rubs, no gallops RESP: Normal chest excursion without splinting or tachypnea; breath sounds clear and equal bilaterally; no wheezes, no rhonchi, no rales,  ABD/GI: Normal bowel sounds; non-distended; soft, non-tender, no rebound, no guarding BACK:  The back appears normal and is non-tender to palpation, there is no CVA tenderness EXT: Very mildly tender to palpation over the posterior left calf with no swelling or ecchymosis or erythema or warmth, there are multiple abrasions to all of his extremities, no appreciate a specific puncture wound, used 2+ DP pulses bilaterally, sensation to light touch intact diffusely, no joint effusions, normal range of motion in all joints, otherwise extremities are non-tender to palpation; no edema; normal capillary refill; no cyanosis    SKIN: Normal color for age and race; warm NEURO:  Moves all extremities equally, sensation to light touch intact diffusely, normal gait PSYCH: The patient's mood and manner are appropriate. Grooming and personal hygiene are appropriate.  MEDICAL DECISION MAKING: Patient here with possible snake bite. Discussed with patient that this was likely a dry bite given he has no signs of swelling, ecchymosis, erythema or warmth and no obvious puncture wound. I do not feel he needs labs at this time is  because if he was bitten by a snake is a dry bite.  I doubt that this is a DVT given the acute onset of pain. He has no history of prior PE or DVT. No recent fracture, surgery, trauma, prolonged immobilization such as long flight or hospitalization.  Well's criteria is negative. No chest pain or shortness of breath. We'll discharge with prescriptions for Vicodin. Have advised him to keep his leg elevated, apply ice as needed. Have discussed return precautions. He verbalizes understanding and is comfortable with plan.       Layla Maw Valentina Alcoser, DO 04/16/14 2041

## 2014-04-16 NOTE — Discharge Instructions (Signed)
Snake Bite °Snakes may be either venomous (containing poison) or nonvenomous (nonpoisonous). A nonvenomous snake bite will cause trauma or a wound to the skin and possibly the deeper tissues. A venomous snake will also cause a traumatic wound, but more importantly, it may have injected venom into the wound. Snake bite venom can be extremely serious and even deadly. One type of venom may cause major skin, tissue and muscle damage, and failure of normal blood clotting. This may cause extreme swelling and pain of the affected area. Another type of venom can affect the brain and nervous system and may cause death. The treatment for venomous snake bite may require the use of antivenom medicine. If you are unsure if your bite is from a venomous snake, you MUST seek immediate medical attention. °YOU MIGHT NEED A TETANUS SHOT NOW IF: °· You have no idea when you had the last one. °· You have never had a tetanus shot before. °· The bite broke your skin. °If you need a tetanus shot, and you decide not to get one, there is a rare chance of getting tetanus. Sickness from tetanus can be serious. °HOME CARE INSTRUCTIONS  °A snake bit you and caused a skin wound. It may or may not have been venomous. If the snake was venomous, a small amount of venom may have been injected into your skin. °· Keep the bite area clean and dry. °· Keep the extremity elevated above the level of the heart for the next 48 hours. °· Wash the bite area 3 times daily with soap and water or an antiseptic. Apply an adhesive or gauze bandage to the bite area. °· If you develop blistering of any type at the site of the bite, protect the blisters from breaking. Do not attempt to open it. °· If you were given a tetanus shot, your arm may get swollen, red and warm at the shot site. This is a common response to the injection. °SEEK IMMEDIATE MEDICAL CARE IF:  °· You develop symptoms of poisoning including increased pain, redness, swelling, blood blisters or purple  spots in the bite area, nausea, vomiting, numbness, tingling, excessive sweating, breathing difficulty, blurred vision, feelings of lightheadedness, or feeling faint. If you develop symptoms of poisoning, you MUST seek immediate medical attention. °· The bite becomes infected. Symptoms may include redness, swelling, pain, tenderness, pus, red streaks running from the wound, or an oral temperature above 102° F (38.9° C), not controlled by medicine. °· Your condition or wound becomes worse. °MAKE SURE YOU:  °· Understand these instructions. °· Will watch your condition. °· Will get help right away if you are not doing well or get worse. °Document Released: 03/29/2000 Document Revised: 06/24/2011 Document Reviewed: 08/23/2009 °ExitCare® Patient Information ©2015 [employer]. This information is not intended to replace advice given to you by your health care provider. Make sure you discuss any questions you have with your health care provider. ° °

## 2014-04-16 NOTE — ED Notes (Signed)
Discharge instructions given, pt demonstrated teach back and verbal understanding. No concerns voiced.  

## 2014-04-16 NOTE — ED Notes (Addendum)
Patient c/o possible snake bite to left calf. Per patient was rabbit hunting when he felt pain in calf and seen an black snake slither away. Patient reports nausea and pain in calf-difficulty bearing weight on left. 2 small puncture wounds noted to left calf.

## 2014-07-11 ENCOUNTER — Emergency Department (HOSPITAL_COMMUNITY)
Admission: EM | Admit: 2014-07-11 | Discharge: 2014-07-11 | Disposition: A | Discharge status: Home / Self Care | Payer: Medicare Other | Attending: Emergency Medicine | Admitting: Emergency Medicine

## 2014-07-11 ENCOUNTER — Encounter (HOSPITAL_COMMUNITY): Payer: Self-pay | Admitting: Emergency Medicine

## 2014-07-11 ENCOUNTER — Emergency Department (HOSPITAL_COMMUNITY): Payer: Medicare Other

## 2014-07-11 DIAGNOSIS — Y998 Other external cause status: Secondary | ICD-10-CM | POA: Diagnosis not present

## 2014-07-11 DIAGNOSIS — S63502A Unspecified sprain of left wrist, initial encounter: Secondary | ICD-10-CM | POA: Insufficient documentation

## 2014-07-11 DIAGNOSIS — S6992XA Unspecified injury of left wrist, hand and finger(s), initial encounter: Secondary | ICD-10-CM | POA: Diagnosis present

## 2014-07-11 DIAGNOSIS — Z720 Tobacco use: Secondary | ICD-10-CM | POA: Insufficient documentation

## 2014-07-11 DIAGNOSIS — Y9289 Other specified places as the place of occurrence of the external cause: Secondary | ICD-10-CM | POA: Insufficient documentation

## 2014-07-11 DIAGNOSIS — Y9389 Activity, other specified: Secondary | ICD-10-CM | POA: Insufficient documentation

## 2014-07-11 DIAGNOSIS — Z791 Long term (current) use of non-steroidal anti-inflammatories (NSAID): Secondary | ICD-10-CM | POA: Diagnosis not present

## 2014-07-11 DIAGNOSIS — W1830XA Fall on same level, unspecified, initial encounter: Secondary | ICD-10-CM | POA: Insufficient documentation

## 2014-07-11 MED ORDER — DICLOFENAC SODIUM 75 MG PO TBEC: 75.0000 mg | DELAYED_RELEASE_TABLET | Freq: Two times a day (BID) | ORAL | Status: DC

## 2014-07-11 NOTE — Discharge Instructions (Signed)
Wrist Pain A wrist sprain happens when the bands of tissue that hold the wrist joints together (ligament) stretch too much or tear. A wrist strain happens when muscles or bands of tissue that connect muscles to bones (tendons) are stretched or pulled. HOME CARE  Put ice on the injured area.  Put ice in a plastic bag.  Place a towel between your skin and the bag.  Leave the ice on for 15-20 minutes, 03-04 times a day, for the first 2 days.  Raise (elevate) the injured wrist to lessen puffiness (swelling).  Rest the injured wrist for at least 48 hours or as told by your doctor.  Wear a splint, cast, or an elastic wrap as told by your doctor.  Only take medicine as told by your doctor.  Follow up with your doctor as told. This is important. GET HELP RIGHT AWAY IF:   The fingers are puffy, very red, white, or cold and blue.  The fingers lose feeling (numb) or tingle.  The pain gets worse.  It is hard to move the fingers. MAKE SURE YOU:   Understand these instructions.  Will watch your condition.  Will get help right away if you are not doing well or get worse. Document Released: 09/18/2007 Document Revised: 06/24/2011 Document Reviewed: 05/23/2010 [facility] Patient Information 2015 [place], [place]. This information is not intended to replace advice given to you by your health care provider. Make sure you discuss any questions you have with your health care provider.  Wrist Pain A wrist sprain happens when the bands of tissue that hold the wrist joints together (ligament) stretch too much or tear. A wrist strain happens when muscles or bands of tissue that connect muscles to bones (tendons) are stretched or pulled. HOME CARE  Put ice on the injured area.  Put ice in a plastic bag.  Place a towel between your skin and the bag.  Leave the ice on for 15-20 minutes, 03-04 times a day, for the first 2 days.  Raise (elevate) the injured wrist to lessen puffiness  (swelling).  Rest the injured wrist for at least 48 hours or as told by your doctor.  Wear a splint, cast, or an elastic wrap as told by your doctor.  Only take medicine as told by your doctor.  Follow up with your doctor as told. This is important. GET HELP RIGHT AWAY IF:   The fingers are puffy, very red, white, or cold and blue.  The fingers lose feeling (numb) or tingle.  The pain gets worse.  It is hard to move the fingers. MAKE SURE YOU:   Understand these instructions.  Will watch your condition.  Will get help right away if you are not doing well or get worse. Document Released: 09/18/2007 Document Revised: 06/24/2011 Document Reviewed: 05/23/2010 [facility]Exit[facility] Patient Information 2015 [place], [place]. This information is not intended to replace advice given to you by your health care provider. Make sure you discuss any questions you have with your health care provider.

## 2014-07-11 NOTE — ED Provider Notes (Signed)
CSN: [id number]     Arrival date & time 07/11/14  1302 History  This chart was scribed for non-physician practitioner, Pauline Ausammy Alantis Bethune, PA-C, working with Raeford RazorStephen Kohut, MD, by Abel PrestoKara Demonbreun, ED Scribe. This patient was seen in room APFT23/APFT23 and the patient's care was started at 1:43 PM.     Chief Complaint  Patient presents with  . Arm Injury      Patient is a 34 y.o. male presenting with arm injury. The history is provided by the patient. No language interpreter was used.  Arm Injury Associated symptoms: no fever    HPI Comments: Manuel Allen is a 34 y.o. male who presents to the Emergency Department complaining of left wrist pain with onset 4 days ago. Pt notes he was hanging sheet rock and fell, landing on an outstretched hand. Pt used his hand to catch his fall at onset.  He reports limited ROM due to pain. Pt has h/o of other injuries to same wrist and metal plate placement.  Pt denies any other injury, head injury, LOC, numbness and swelling. Pt has an orthopedist.   Past Medical History  Diagnosis Date  . Back pain    Past Surgical History  Procedure Laterality Date  . Arm surgery      Open treatment internal fixation left forearm radius plate   Family History  Problem Relation Age of Onset  . Hypertension Mother   . Cancer Other   . Diabetes Other    History  Substance Use Topics  . Smoking status: Current Every Day Smoker -- 1.00 packs/day for 23 years    Types: Cigarettes  . Smokeless tobacco: Current User    Types: Chew  . Alcohol Use: No    Review of Systems  Constitutional: Negative for fever and chills.  Genitourinary: Negative for dysuria and difficulty urinating.  Musculoskeletal: Positive for myalgias and arthralgias (left wrist pain). Negative for joint swelling.  Skin: Negative for color change and wound.  All other systems reviewed and are negative.     Allergies  Codeine  Home Medications   Prior to Admission medications    Medication Sig Start Date End Date Taking? Authorizing Provider  HYDROcodone-acetaminophen (NORCO/VICODIN) 5-325 MG per tablet Take one-two tabs po q 4-6 hrs prn pain Patient not taking: Reported on 04/16/2014 09/28/13   Tammi Jatavion Peaster, PA-C  HYDROcodone-acetaminophen (NORCO/VICODIN) 5-325 MG per tablet Take 1 tablet by mouth every 4 (four) hours as needed. 04/16/14   Kristen N Ward, DO  naproxen (NAPROSYN) 500 MG tablet Take 1 tablet (500 mg total) by mouth 2 (two) times daily. Patient not taking: Reported on 04/16/2014 09/28/13   Tammi Kweli Grassel, PA-C   BP 152/90 mmHg  Pulse 68  Temp(Src) 98.1 F (36.7 C) (Oral)  Resp 18  Ht 6' (1.829 m)  Wt 220 lb (99.791 kg)  BMI 29.83 kg/m2  SpO2 97% Physical Exam  Constitutional: He is oriented to person, place, and time. He appears well-developed and well-nourished.  HENT:  Head: Normocephalic.  Eyes: Conjunctivae are normal.  Neck: Normal range of motion. Neck supple.  Cardiovascular: Normal rate, regular rhythm, normal heart sounds and intact distal pulses.   Pulmonary/Chest: Effort normal and breath sounds normal. No respiratory distress. He has no wheezes. He has no rales.  Musculoskeletal: Normal range of motion.  Tenderness to medial left wrist; no bony deformity or erythema; NVI. No proximal tenderness.  Compartments soft  Neurological: He is alert and oriented to person, place, and time.  Skin: Skin  is warm and dry.  Psychiatric: He has a normal mood and affect. His behavior is normal.  Nursing note and vitals reviewed.   ED Course  Procedures (including critical care time) DIAGNOSTIC STUDIES: Oxygen Saturation is 97% on room air, normal by my interpretation.    COORDINATION OF CARE: 1:48 PM Discussed treatment plan with patient at beside, the patient agrees with the plan and has no further questions at this time.   Labs Review Labs Reviewed - No data to display  Imaging Review Dg Wrist Complete Left  07/11/2014   CLINICAL DATA:   Left wrist pain following fall from ladder, initial encounter  EXAM: LEFT WRIST - COMPLETE 3+ VIEW  COMPARISON:  09/28/2013  FINDINGS: Postsurgical changes are noted in the distal radius. No acute fracture is identified. There is some regularity of the lunate bone noted which is stable in appearance. Degenerative changes of the radiocarpal articulation are noted as well.  IMPRESSION: Chronic changes without acute abnormality.   Electronically Signed   By: Alcide Clever M.D.   On: 07/11/2014 13:29     EKG Interpretation None      MDM   Final diagnoses:  Wrist sprain, left, initial encounter   Pt with likely wrist sprain, XR negative for fx.  Wrist splint applied, pain improved.  Pt agrees to arrange ortho f/u in one week if needed.  I personally performed the services described in this documentation, which was scribed in my presence. The recorded information has been reviewed and is accurate.'  Severiano Gilbert, PA-C 07/12/14 2216  Raeford Razor, MD 07/13/14 [phone]

## 2014-07-11 NOTE — ED Notes (Signed)
Injury to left arm on Thursday, hanging sheet rock and fell. On left arm.  Rates pain 10/10.  Took Aleve with no relief.

## 2014-07-11 NOTE — ED Notes (Signed)
No obvious deformity noted. Left radial pulse strong and intact. Cap refill wnl.

## 2014-07-11 NOTE — ED Notes (Signed)
Pain lt wrist, Pt alert, NAD

## 2014-09-08 ENCOUNTER — Emergency Department (HOSPITAL_COMMUNITY)
Admission: EM | Admit: 2014-09-08 | Discharge: 2014-09-08 | Disposition: A | Discharge status: Home / Self Care | Payer: Medicare Other | Attending: Emergency Medicine | Admitting: Emergency Medicine

## 2014-09-08 ENCOUNTER — Emergency Department (HOSPITAL_COMMUNITY): Payer: Medicare Other

## 2014-09-08 ENCOUNTER — Encounter (HOSPITAL_COMMUNITY): Payer: Self-pay

## 2014-09-08 DIAGNOSIS — Z791 Long term (current) use of non-steroidal anti-inflammatories (NSAID): Secondary | ICD-10-CM | POA: Diagnosis not present

## 2014-09-08 DIAGNOSIS — R1110 Vomiting, unspecified: Secondary | ICD-10-CM | POA: Insufficient documentation

## 2014-09-08 DIAGNOSIS — Z720 Tobacco use: Secondary | ICD-10-CM | POA: Diagnosis not present

## 2014-09-08 DIAGNOSIS — Z87442 Personal history of urinary calculi: Secondary | ICD-10-CM | POA: Diagnosis not present

## 2014-09-08 DIAGNOSIS — R52 Pain, unspecified: Secondary | ICD-10-CM

## 2014-09-08 DIAGNOSIS — R109 Unspecified abdominal pain: Secondary | ICD-10-CM | POA: Diagnosis present

## 2014-09-08 LAB — CBC WITH DIFFERENTIAL/PLATELET
Basophils Absolute: 0 10*3/uL (ref 0.0–0.1)
Basophils Relative: 0 % (ref 0–1)
Eosinophils Absolute: 0.1 10*3/uL (ref 0.0–0.7)
Eosinophils Relative: 1 % (ref 0–5)
HCT: 44.3 % (ref 39.0–52.0)
HEMOGLOBIN: 14.9 g/dL (ref 13.0–17.0)
Lymphocytes Relative: 18 % (ref 12–46)
Lymphs Abs: 1.7 10*3/uL (ref 0.7–4.0)
MCH: 29.6 pg (ref 26.0–34.0)
MCHC: 33.6 g/dL (ref 30.0–36.0)
MCV: 88.1 fL (ref 78.0–100.0)
Monocytes Absolute: 0.5 10*3/uL (ref 0.1–1.0)
Monocytes Relative: 5 % (ref 3–12)
NEUTROS ABS: 7.4 10*3/uL (ref 1.7–7.7)
Neutrophils Relative %: 75 % (ref 43–77)
Platelets: 208 10*3/uL (ref 150–400)
RBC: 5.03 MIL/uL (ref 4.22–5.81)
RDW: 13.3 % (ref 11.5–15.5)
WBC: 9.8 10*3/uL (ref 4.0–10.5)

## 2014-09-08 LAB — URINALYSIS, ROUTINE W REFLEX MICROSCOPIC
BILIRUBIN URINE: NEGATIVE
Glucose, UA: NEGATIVE mg/dL
KETONES UR: NEGATIVE mg/dL
Leukocytes, UA: NEGATIVE
NITRITE: NEGATIVE
Protein, ur: NEGATIVE mg/dL
Specific Gravity, Urine: 1.02 (ref 1.005–1.030)
Urobilinogen, UA: 0.2 mg/dL (ref 0.0–1.0)
pH: 6 (ref 5.0–8.0)

## 2014-09-08 LAB — BASIC METABOLIC PANEL
ANION GAP: 8 (ref 5–15)
BUN: 7 mg/dL (ref 6–20)
CHLORIDE: 103 mmol/L (ref 101–111)
CO2: 29 mmol/L (ref 22–32)
Calcium: 9.7 mg/dL (ref 8.9–10.3)
Creatinine, Ser: 1.06 mg/dL (ref 0.61–1.24)
GFR calc non Af Amer: >60 mL/min (ref >60)
GLUCOSE: 100 mg/dL — AB (ref 65–99)
POTASSIUM: 3.8 mmol/L (ref 3.5–5.1)
Sodium: 140 mmol/L (ref 135–145)

## 2014-09-08 LAB — HEPATIC FUNCTION PANEL
ALT: 24 U/L (ref 17–63)
AST: 22 U/L (ref 15–41)
Albumin: 4.2 g/dL (ref 3.5–5.0)
Alkaline Phosphatase: 92 U/L (ref 38–126)
BILIRUBIN DIRECT: 0.1 mg/dL (ref 0.1–0.5)
BILIRUBIN INDIRECT: 0.5 mg/dL (ref 0.3–0.9)
BILIRUBIN TOTAL: 0.6 mg/dL (ref 0.3–1.2)
TOTAL PROTEIN: 6.9 g/dL (ref 6.5–8.1)

## 2014-09-08 LAB — URINE MICROSCOPIC-ADD ON

## 2014-09-08 MED ORDER — CEPHALEXIN 500 MG PO CAPS
500.0000 mg | ORAL_CAPSULE | Freq: Once | ORAL | Status: AC
  Administered 2014-09-08: 500 mg via ORAL
  Filled 2014-09-08: qty 1

## 2014-09-08 MED ORDER — TRAMADOL HCL 50 MG PO TABS: 50.0000 mg | ORAL_TABLET | Freq: Four times a day (QID) | ORAL | Status: DC | PRN

## 2014-09-08 MED ORDER — KETOROLAC TROMETHAMINE 30 MG/ML IJ SOLN
30.0000 mg | Freq: Once | INTRAMUSCULAR | Status: AC
  Administered 2014-09-08: 30 mg via INTRAVENOUS
  Filled 2014-09-08: qty 1

## 2014-09-08 MED ORDER — ONDANSETRON HCL 4 MG/2ML IJ SOLN
4.0000 mg | Freq: Once | INTRAMUSCULAR | Status: AC
  Administered 2014-09-08: 4 mg via INTRAVENOUS
  Filled 2014-09-08: qty 2

## 2014-09-08 MED ORDER — CEPHALEXIN 500 MG PO CAPS: 500.0000 mg | ORAL_CAPSULE | Freq: Four times a day (QID) | ORAL | Status: DC

## 2014-09-08 NOTE — ED Notes (Signed)
Pt reports history of kidney stones and has had r flank pain with vomiting since yesterday.  C/O burning with urination.

## 2014-09-08 NOTE — ED Notes (Signed)
Pt back from CT

## 2014-09-08 NOTE — Discharge Instructions (Signed)
Drink plenty of fluds.   Follow up with your md next week.

## 2014-09-08 NOTE — ED Provider Notes (Signed)
CSN: [id number]     Arrival date & time 09/08/14  1006 History  This chart was scribed for Manuel Berkshire, MD by Leona Carry, ED Scribe. The patient was seen in APA18/APA18. The patient's care was started at 10:41 AM.     Chief Complaint  Patient presents with  . Flank Pain   Patient is a 34 y.o. male presenting with flank pain. The history is provided by the patient. No language interpreter was used.  Flank Pain This is a new problem. The current episode started yesterday. The problem occurs constantly. The problem has not changed since onset.Pertinent negatives include no chest pain, no abdominal pain and no headaches. Nothing aggravates the symptoms. Nothing relieves the symptoms.   HPI Comments: Manuel Allen is a 34 y.o. male who presents to the Emergency Department complaining of right flank pain beginning yesterday. Patient reports associated vomiting. He has had 2 episodes of vomiting since yesterday. He has a history of kidney stones and states that the pain feels similar to when he had a kidney stone 2 years ago. He has taken ibuprofen with no relief of the pain. Patient denies fever or chills.  PCP is Dr. Janna Arch.   Past Medical History  Diagnosis Date  . Back pain   . Renal disorder     kidney stones   Past Surgical History  Procedure Laterality Date  . Arm surgery      Open treatment internal fixation left forearm radius plate   Family History  Problem Relation Age of Onset  . Hypertension Mother   . Cancer Other   . Diabetes Other    History  Substance Use Topics  . Smoking status: Current Every Day Smoker -- 1.00 packs/day for 23 years    Types: Cigarettes  . Smokeless tobacco: Current User    Types: Chew  . Alcohol Use: No    Review of Systems  Constitutional: Negative for chills, appetite change and fatigue.  HENT: Negative for congestion, ear discharge and sinus pressure.   Eyes: Negative for discharge.  Respiratory: Negative for cough.    Cardiovascular: Negative for chest pain.  Gastrointestinal: Positive for vomiting. Negative for abdominal pain and diarrhea.  Genitourinary: Positive for flank pain. Negative for frequency and hematuria.  Musculoskeletal: Negative for back pain.  Skin: Negative for rash.  Neurological: Negative for seizures and headaches.  Psychiatric/Behavioral: Negative for hallucinations.      Allergies  Codeine  Home Medications   Prior to Admission medications   Medication Sig Start Date End Date Taking? Authorizing Provider  diclofenac (VOLTAREN) 75 MG EC tablet Take 1 tablet (75 mg total) by mouth 2 (two) times daily. Take with food 07/11/14   [name], PA-C  Naproxen Sodium 220 MG CAPS Take 440 mg by mouth every 6 (six) hours as needed (taking for pain.).    Historical Provider, MD   Triage Vitals: BP 160/82 mmHg  Pulse 76  Temp(Src) 98 F (36.7 C) (Oral)  Resp 20  Ht 6' (1.829 m)  Wt 230 lb (104.327 kg)  BMI 31.19 kg/m2  SpO2 99% Physical Exam  Constitutional: He is oriented to person, place, and time. He appears well-developed.  HENT:  Head: Normocephalic.  Eyes: Conjunctivae and EOM are normal. No scleral icterus.  Neck: Neck supple. No thyromegaly present.  Cardiovascular: Normal rate and regular rhythm.  Exam reveals no gallop and no friction rub.   No murmur heard. Pulmonary/Chest: No stridor. He has no wheezes. He has no rales.  He exhibits no tenderness.  Abdominal: He exhibits no distension. There is no tenderness. There is no rebound.  Genitourinary:  Moderate right flank tenderness.  Musculoskeletal: Normal range of motion. He exhibits no edema.  Lymphadenopathy:    He has no cervical adenopathy.  Neurological: He is oriented to person, place, and time. He exhibits normal muscle tone. Coordination normal.  Skin: No rash noted. No erythema.  Psychiatric: He has a normal mood and affect. His behavior is normal.  Nursing note and vitals reviewed.   ED Course   Procedures (including critical care time) DIAGNOSTIC STUDIES: Oxygen Saturation is 99% on room air, normal by my interpretation.    COORDINATION OF CARE:    Labs Review Labs Reviewed  URINALYSIS, ROUTINE W REFLEX MICROSCOPIC (NOT AT [facility])  CBC WITH DIFFERENTIAL/PLATELET  BASIC METABOLIC PANEL    Imaging Review No results found.   EKG Interpretation None      MDM   Final diagnoses:  None   Flank pain,  Ct suggest inflamed bladder.  Will cx urine and cover with keflex and ultram      The chart was scribed for me under my direct supervision.  I personally performed the history, physical, and medical decision making and all procedures in the evaluation of this patient.Manuel Allen.    Natale Thoma, MD 09/08/14 [phone]

## 2014-09-09 LAB — URINE CULTURE: Colony Count: 2000

## 2014-10-25 ENCOUNTER — Emergency Department (HOSPITAL_COMMUNITY): Payer: Medicare Other

## 2014-10-25 ENCOUNTER — Encounter (HOSPITAL_COMMUNITY): Payer: Self-pay | Admitting: Emergency Medicine

## 2014-10-25 ENCOUNTER — Emergency Department (HOSPITAL_COMMUNITY)
Admission: EM | Admit: 2014-10-25 | Discharge: 2014-10-25 | Disposition: A | Discharge status: Home / Self Care | Payer: Medicare Other | Attending: Emergency Medicine | Admitting: Emergency Medicine

## 2014-10-25 DIAGNOSIS — Y9289 Other specified places as the place of occurrence of the external cause: Secondary | ICD-10-CM | POA: Insufficient documentation

## 2014-10-25 DIAGNOSIS — X58XXXA Exposure to other specified factors, initial encounter: Secondary | ICD-10-CM | POA: Insufficient documentation

## 2014-10-25 DIAGNOSIS — Y998 Other external cause status: Secondary | ICD-10-CM | POA: Diagnosis not present

## 2014-10-25 DIAGNOSIS — S6992XA Unspecified injury of left wrist, hand and finger(s), initial encounter: Secondary | ICD-10-CM | POA: Diagnosis present

## 2014-10-25 DIAGNOSIS — S56912A Strain of unspecified muscles, fascia and tendons at forearm level, left arm, initial encounter: Secondary | ICD-10-CM | POA: Diagnosis not present

## 2014-10-25 DIAGNOSIS — Z720 Tobacco use: Secondary | ICD-10-CM | POA: Diagnosis not present

## 2014-10-25 DIAGNOSIS — Z87442 Personal history of urinary calculi: Secondary | ICD-10-CM | POA: Insufficient documentation

## 2014-10-25 DIAGNOSIS — Y9389 Activity, other specified: Secondary | ICD-10-CM | POA: Insufficient documentation

## 2014-10-25 MED ORDER — NAPROXEN 250 MG PO TABS
500.0000 mg | ORAL_TABLET | Freq: Once | ORAL | Status: AC
  Administered 2014-10-25: 500 mg via ORAL
  Filled 2014-10-25: qty 2

## 2014-10-25 MED ORDER — TRAMADOL HCL 50 MG PO TABS: 50.0000 mg | ORAL_TABLET | Freq: Four times a day (QID) | ORAL | Status: DC | PRN

## 2014-10-25 MED ORDER — NAPROXEN 500 MG PO TABS: 500.0000 mg | ORAL_TABLET | Freq: Two times a day (BID) | ORAL | Status: DC

## 2014-10-25 MED ORDER — TRAMADOL HCL 50 MG PO TABS
50.0000 mg | ORAL_TABLET | Freq: Once | ORAL | Status: AC
  Administered 2014-10-25: 50 mg via ORAL
  Filled 2014-10-25: qty 1

## 2014-10-25 NOTE — Discharge Instructions (Signed)
Strain A strain is an injury to a muscle or the tissue that connects muscles to bones (tendon). In a strain injury, the muscle or tendon is either stretched or torn. Muscles are more susceptible to strains if they cross two joints, such as:  Hamstrings.  Quadriceps.  Calves.  Biceps. There are three categories of strains:  A first-degree strain is a small tear in the muscle. There is no lengthening of the muscle, but pain may be present with contraction of the muscle.  A second-degree strain is a small tear in the muscle accompanied by lengthening of the muscle. Muscles with a second-degree strain are still able to function.  A third-degree strain is a complete tear of the muscle. Muscles with a third-degree strain cannot function properly. Strains often have bleeding and bruising within the muscle. SYMPTOMS   Pain, tenderness, redness or bruising, and swelling in the area of injury.  Loss of normal mobility of the injured joint. CAUSES  A sudden force exerted on a muscle or tendon that it cannot withstand usually causes strains. This may be due to a sudden overload of a contracted muscle, overuse, or sudden increase or change in activity.  RISK INCREASES WITH:  Trauma.  Poor strength and flexibility.  Failure to warm-up properly before activity.  Return to activity before healing is complete. PREVENTION  Warm-up and stretch properly before and activity.  Maintain physical fitness:  Joint flexibility.  Muscle strength.  Endurance and conditioning.  Strengthen weak muscles with exercises to prevent recurrence. PROGNOSIS  If treated properly, strains are usually curable. The time it takes to recover is related to the severity of the injury and usually varies from 2 to 8 weeks. RELATED COMPLICATIONS   Re-injury or recurrence of symptoms, permanent weakness.  Joint stiffness if the strain is severe and rehabilitation is incomplete.  Delayed healing or resolution of  symptoms if sports are resumed before rehabilitation is complete.  Excessive bleeding into muscle, especially if taking anti-inflammatory medicines. This can lead to delayed recovery and injury to nerves, muscle, and blood vessels; this is an emergency. TREATMENT  Treatment initially involves ice and medicine to help reduce pain and inflammation. Use of the affected muscle should be limited by a:  Brace.  Elastic bandage wrapping.  Splint.  Cast.  Sling. Strengthening and stretching exercises may be necessary after immobilization to prevent joint stiffness. These exercises may be completed at home or with a therapist. If the tendon is torn, then surgery may be necessary to repair it.  MEDICATION   Avoid aspirin or ibuprofen in the first 48 hours after the injury. These medicines may increase the tendency to bleed. During this time, you may take pain relievers, such as acetaminophen, that do not affect bleeding.  After the first 48 hours, if pain medicine is necessary, then nonsteroidal anti-inflammatory medicines, such as aspirin and ibuprofen, or other minor pain relievers, such as acetaminophen, are often recommended.  Do not take pain medicine within 7 days before surgery.  Prescription pain relievers may be prescribed. Use only as directed and only as much as you need  Ointments applied to the skin may be helpful. HEAT AND COLD  Cold treatment (icing) relieves pain and reduces inflammation. Cold treatment should be applied for 10 to 15 minutes every 2 to 3 hours for inflammation and pain and immediately after any activity that aggravates your symptoms. Use ice packs or massage the area with a piece of ice (ice massage).  Heat treatment may be  used prior to performing the stretching and strengthening activities prescribed by your caregiver, physical therapist, or athletic trainer. Use a heat pack or soak your injury in warm water. SEEK MEDICAL CARE IF:   Symptoms get worse or do  not improve despite treatment.  Pain becomes intolerable.  You experience numbness or tingling.  Toes or fingernails become cold or develop a blue, gray, or dusky color.  New, unexplained symptoms develop (drugs used in treatment may produce side effects). Document Released: 04/01/2005 Document Revised: 06/24/2011 Document Reviewed: 07/14/2008 [facility] Patient Information 2015 [place]. This information is not intended to replace advice given to you by your health care provider. Make sure you discuss any questions you have with your health care provider.

## 2014-10-25 NOTE — ED Notes (Signed)
Injury to left wrist lifting a wall.  Rates pain 10/10.  Did not take any medication for pain.

## 2014-10-26 NOTE — ED Provider Notes (Signed)
CSN: [id number]     Arrival date & time 10/25/14  1301 History   First MD Initiated Contact with Patient 10/25/14 1454     Chief Complaint  Patient presents with  . Wrist Pain     (Consider location/radiation/quality/duration/timing/severity/associated sxs/prior Treatment) The history is provided by the patient.   Manuel Allen is a 34 y.o. male with a  History of left radius fracture with internal fixation when a child presenting with acute wrist and forearm pain when he had a sudden popping sensation today when helping to lift a wall into position with a construction project. Since this event he has had persistent pain without radiation and is concerned for fracture or hardware failure.  He denies numbness or weakness in his hand since the event. Pain is worsened with movement and palpation.  He has found no alleviators.   Past Medical History  Diagnosis Date  . Back pain   . Renal disorder     kidney stones   Past Surgical History  Procedure Laterality Date  . Arm surgery      Open treatment internal fixation left forearm radius plate   Family History  Problem Relation Age of Onset  . Hypertension Mother   . Cancer Other   . Diabetes Other    History  Substance Use Topics  . Smoking status: Current Every Day Smoker -- 1.00 packs/day for 23 years    Types: Cigarettes  . Smokeless tobacco: Current User    Types: Chew  . Alcohol Use: No    Review of Systems  Constitutional: Negative for fever.  Musculoskeletal: Positive for arthralgias. Negative for myalgias and joint swelling.  Neurological: Negative for weakness and numbness.      Allergies  Codeine  Home Medications   Prior to Admission medications   Medication Sig Start Date End Date Taking? Authorizing Provider  cephALEXin (KEFLEX) 500 MG capsule Take 1 capsule (500 mg total) by mouth 4 (four) times daily. Patient not taking: Reported on 10/25/2014 09/08/14   Bethann BerkshireJoseph Zammit, MD  diclofenac (VOLTAREN) 75 MG  EC tablet Take 1 tablet (75 mg total) by mouth 2 (two) times daily. Take with food Patient not taking: Reported on 09/08/2014 07/11/14   [name], PA-C  naproxen (NAPROSYN) 500 MG tablet Take 1 tablet (500 mg total) by mouth 2 (two) times daily. 10/25/14   Burgess AmorJulie Rini Moffit, PA-C  traMADol (ULTRAM) 50 MG tablet Take 1 tablet (50 mg total) by mouth every 6 (six) hours as needed. 10/25/14   Burgess AmorJulie Gyanna Jarema, PA-C   BP 132/68 mmHg  Pulse 66  Temp(Src) 98 F (36.7 C) (Oral)  Resp 15  Ht 6' (1.829 m)  Wt 231 lb (104.781 kg)  BMI 31.32 kg/m2  SpO2 100% Physical Exam  Constitutional: He appears well-developed and well-nourished.  HENT:  Head: Atraumatic.  Neck: Normal range of motion.  Cardiovascular:  Pulses equal bilaterally  Musculoskeletal: He exhibits tenderness.       Left wrist: He exhibits bony tenderness. He exhibits no swelling, no effusion and no crepitus.  ttp distal radius. No deformity or edema. No crepitus. Distal cap refill less than 2 secs. Radial pulse is full.  Neurological: He is alert. He has normal strength. He displays normal reflexes. No sensory deficit.  Skin: Skin is warm and dry.  Psychiatric: He has a normal mood and affect.    ED Course  Procedures (including critical care time) Labs Review Labs Reviewed - No data to display  Imaging Review Dg  Wrist Complete Left  10/25/2014   CLINICAL DATA:  Left wrist injury and pain after lifting a wall. Initial encounter. Previous forearm fracture.  EXAM: LEFT WRIST - COMPLETE 3+ VIEW  COMPARISON:  07/11/2014  FINDINGS: Fixation plate and screws again seen in the distal radius. No evidence of acute fracture or dislocation. 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 No other significant bone abnormality identified.  IMPRESSION: No acute findings.  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 Fixation plate and screws in distal radius.   Electronically Signed   By: Myles Rosenthal M.D.   On:  10/25/2014 13:37     EKG Interpretation None      MDM   Final diagnoses:  Forearm strain, left, initial encounter    Patients labs and/or radiological studies were reviewed and considered during the medical decision making and disposition process.  Results were also discussed with patient. Pt placed in wrist splint, advised RICE, tramadol and naproxen prescribed.  Pt was referred to Dr. Hilda Lias for further eval if sx persist despite tx.    Burgess Amor, PA-C 10/26/14 2229  Bethann Berkshire, MD 10/26/14 [phone]

## 2014-11-28 ENCOUNTER — Emergency Department (HOSPITAL_COMMUNITY): Payer: Medicare Other

## 2014-11-28 ENCOUNTER — Encounter (HOSPITAL_COMMUNITY): Payer: Self-pay | Admitting: Emergency Medicine

## 2014-11-28 ENCOUNTER — Emergency Department (HOSPITAL_COMMUNITY)
Admission: EM | Admit: 2014-11-28 | Discharge: 2014-11-28 | Disposition: A | Discharge status: Home / Self Care | Payer: Medicare Other | Attending: Emergency Medicine | Admitting: Emergency Medicine

## 2014-11-28 DIAGNOSIS — Y9289 Other specified places as the place of occurrence of the external cause: Secondary | ICD-10-CM | POA: Insufficient documentation

## 2014-11-28 DIAGNOSIS — Y9389 Activity, other specified: Secondary | ICD-10-CM | POA: Diagnosis not present

## 2014-11-28 DIAGNOSIS — Y998 Other external cause status: Secondary | ICD-10-CM | POA: Diagnosis not present

## 2014-11-28 DIAGNOSIS — S8392XA Sprain of unspecified site of left knee, initial encounter: Secondary | ICD-10-CM | POA: Insufficient documentation

## 2014-11-28 DIAGNOSIS — Z720 Tobacco use: Secondary | ICD-10-CM | POA: Insufficient documentation

## 2014-11-28 DIAGNOSIS — W1842XA Slipping, tripping and stumbling without falling due to stepping into hole or opening, initial encounter: Secondary | ICD-10-CM | POA: Insufficient documentation

## 2014-11-28 DIAGNOSIS — S8992XA Unspecified injury of left lower leg, initial encounter: Secondary | ICD-10-CM | POA: Diagnosis present

## 2014-11-28 DIAGNOSIS — Z791 Long term (current) use of non-steroidal anti-inflammatories (NSAID): Secondary | ICD-10-CM | POA: Diagnosis not present

## 2014-11-28 DIAGNOSIS — Z87442 Personal history of urinary calculi: Secondary | ICD-10-CM | POA: Diagnosis not present

## 2014-11-28 MED ORDER — IBUPROFEN 800 MG PO TABS
800.0000 mg | ORAL_TABLET | Freq: Once | ORAL | Status: AC
  Administered 2014-11-28: 800 mg via ORAL
  Filled 2014-11-28: qty 1

## 2014-11-28 NOTE — Discharge Instructions (Signed)
Your x-ray is negative for fracture, dislocation, or large effusion. Your examination suggested a strain/sprain of the knee. Please use the knee immobilizer for the next 10 days. Please apply ice for the next 3 days. Please see Dr. Romeo Apple for additional evaluation if not improving. Knee Sprain A knee sprain is a tear in one of the strong, fibrous tissues that connect the bones (ligaments) in your knee. The severity of the sprain depends on how much of the ligament is torn. The tear can be either partial or complete. CAUSES  Often, sprains are a result of a fall or injury. The force of the impact causes the fibers of your ligament to stretch too much. This excess tension causes the fibers of your ligament to tear. SIGNS AND SYMPTOMS  You may have some loss of motion in your knee. Other symptoms include:  Bruising.  Pain in the knee area.  Tenderness of the knee to the touch.  Swelling. DIAGNOSIS  To diagnose a knee sprain, your health care provider will physically examine your knee. Your health care provider may also suggest an X-ray exam of your knee to make sure no bones are broken. TREATMENT  If your ligament is only partially torn, treatment usually involves keeping the knee in a fixed position (immobilization) or bracing your knee for activities that require movement for several weeks. To do this, your health care provider will apply a bandage, cast, or splint to keep your knee from moving and to support your knee during movement until it heals. For a partially torn ligament, the healing process usually takes 4-6 weeks. If your ligament is completely torn, depending on which ligament it is, you may need surgery to reconnect the ligament to the bone or reconstruct it. After surgery, a cast or splint may be applied and will need to stay on your knee for 4-6 weeks while your ligament heals. HOME CARE INSTRUCTIONS  Keep your injured knee elevated to decrease swelling.  To ease pain and  swelling, apply ice to the injured area:  Put ice in a plastic bag.  Place a towel between your skin and the bag.  Leave the ice on for 20 minutes, 2-3 times a day.  Only take medicine for pain as directed by your health care provider.  Do not leave your knee unprotected until pain and stiffness go away (usually 4-6 weeks).  If you have a cast or splint, do not allow it to get wet. If you have been instructed not to remove it, cover it with a plastic bag when you shower or bathe. Do not swim.  Your health care provider may suggest exercises for you to do during your recovery to prevent or limit permanent weakness and stiffness. SEEK IMMEDIATE MEDICAL CARE IF:  Your cast or splint becomes damaged.  Your pain becomes worse.  You have significant pain, swelling, or numbness below the cast or splint. MAKE SURE YOU:  Understand these instructions.  Will watch your condition.  Will get help right away if you are not doing well or get worse. Document Released: 04/01/2005 Document Revised: 01/20/2013 Document Reviewed: 11/11/2012 [facility] Patient Information 2015 [place], [place]. This information is not intended to replace advice given to you by your health care provider. Make sure you discuss any questions you have with your health care provider.

## 2014-11-28 NOTE — ED Provider Notes (Signed)
CSN: [id number]     Arrival date & time 11/28/14  1445 History  This chart was scribed for non-physician practitioner Ivery Quale, PA-C, working with Nelva Nay, MD by Littie Deeds, ED Scribe. This patient was seen in room APFT23/APFT23 and the patient's care was started at 3:30 PM.    Chief Complaint  Patient presents with  . Knee Pain   The history is provided by the patient. No language interpreter was used.   HPI Comments: Manuel Allen is a 34 y.o. male who presents to the Emergency Department complaining of sudden onset left knee pain that started yesterday after he stepped into a hole while cutting grass. The pain is worse with walking. He has been able to bear weight and ambulate, but with pain. He denies being on any blood thinners. Patient does note having a prior injury to his left knee about 24 years ago in which he injured his knee on a rock. He was seen in the ED at that time. He admits to smoking.   Past Medical History  Diagnosis Date  . Back pain   . Renal disorder     kidney stones   Past Surgical History  Procedure Laterality Date  . Arm surgery      Open treatment internal fixation left forearm radius plate   Family History  Problem Relation Age of Onset  . Hypertension Mother   . Cancer Other   . Diabetes Other    Social History  Substance Use Topics  . Smoking status: Current Every Day Smoker -- 1.00 packs/day for 23 years    Types: Cigarettes  . Smokeless tobacco: Current User    Types: Chew  . Alcohol Use: No    Review of Systems  Musculoskeletal: Positive for arthralgias.  All other systems reviewed and are negative.     Allergies  Codeine  Home Medications   Prior to Admission medications   Medication Sig Start Date End Date Taking? Authorizing Provider  cephALEXin (KEFLEX) 500 MG capsule Take 1 capsule (500 mg total) by mouth 4 (four) times daily. Patient not taking: Reported on 10/25/2014 09/08/14   Bethann Berkshire, MD  diclofenac  (VOLTAREN) 75 MG EC tablet Take 1 tablet (75 mg total) by mouth 2 (two) times daily. Take with food Patient not taking: Reported on 09/08/2014 07/11/14   [name], PA-C  naproxen (NAPROSYN) 500 MG tablet Take 1 tablet (500 mg total) by mouth 2 (two) times daily. 10/25/14   Burgess Amor, PA-C  traMADol (ULTRAM) 50 MG tablet Take 1 tablet (50 mg total) by mouth every 6 (six) hours as needed. 10/25/14   Burgess Amor, PA-C   BP 144/90 mmHg  Pulse 85  Temp(Src) 98.1 F (36.7 C) (Oral)  Resp 18  Ht 6' (1.829 m)  Wt 230 lb (104.327 kg)  BMI 31.19 kg/m2  SpO2 100% Physical Exam  Constitutional: He is oriented to person, place, and time. He appears well-developed and well-nourished. No distress.  HENT:  Head: Normocephalic and atraumatic.  Mouth/Throat: Oropharynx is clear and moist. No oropharyngeal exudate.  Eyes: Pupils are equal, round, and reactive to light.  Neck: Neck supple.  Cardiovascular: Normal rate, regular rhythm and normal heart sounds.  Exam reveals no gallop and no friction rub.   No murmur heard. Normal cardiac.  Pulmonary/Chest: Effort normal.  Musculoskeletal: Normal range of motion. He exhibits tenderness. He exhibits no edema.  Full ROM of left hip. Mild puffiness of the left knee. Pain to palpation to  medial aspect of knee. No posterior mass. No deformity of the patella tendon. No tibial deformity present. Achilles tendon in intact.  Neurological: He is alert and oriented to person, place, and time. No cranial nerve deficit.  Skin: Skin is warm and dry. No rash noted.  Psychiatric: He has a normal mood and affect. His behavior is normal.  Nursing note and vitals reviewed.   ED Course  Procedures  DIAGNOSTIC STUDIES: Oxygen Saturation is 100% on room air, normal by my interpretation.    COORDINATION OF CARE: 3:36 PM-Discussed treatment plan which includes knee immobilizer with patient/guardian at bedside and patient/guardian agreed to plan.    Labs Review Labs  Reviewed - No data to display  Imaging Review Dg Knee Complete 4 Views Left  11/28/2014   CLINICAL DATA:  Pain around patellar area. Stepped in hole yesterday.  EXAM: LEFT KNEE - COMPLETE 4+ VIEW  COMPARISON:  None.  FINDINGS: No acute bony abnormality. Specifically, no fracture, subluxation, or dislocation. Soft tissues are intact. Exostosis noted off the lateral proximal tibia.  IMPRESSION: No acute bony abnormality.   Electronically Signed   By: Charlett Nose M.D.   On: 11/28/2014 15:26   I personally reviewed and evaluated these images and lab results as part of my medical decision-making.   EKG Interpretation None      MDM  Patient stepped in a hole and has pain about his patella area. The x-ray of the left knee is negative for fracture or dislocation. The patient is fitted with a knee immobilizer. The patient is referred to orthopedics for additional evaluation.    Final diagnoses:  None    **I have reviewed nursing notes, vital signs, and all appropriate lab and imaging results for this patient.*  **I personally performed the services described in this documentation, which was scribed in my presence. The recorded information has been reviewed and is accurate.Ivery Quale, PA-C 11/28/14 1625  Nelva Nay, MD 12/01/14 [phone]

## 2014-11-28 NOTE — ED Notes (Signed)
Pt c/o left knee pain after stepping into hole while cutting grass yesterday.

## 2014-12-12 ENCOUNTER — Encounter (HOSPITAL_COMMUNITY): Payer: Self-pay | Admitting: Cardiology

## 2014-12-12 ENCOUNTER — Emergency Department (HOSPITAL_COMMUNITY): Payer: Medicare Other

## 2014-12-12 ENCOUNTER — Emergency Department (HOSPITAL_COMMUNITY)
Admission: EM | Admit: 2014-12-12 | Discharge: 2014-12-12 | Disposition: A | Discharge status: Home / Self Care | Payer: Medicare Other | Attending: Emergency Medicine | Admitting: Emergency Medicine

## 2014-12-12 DIAGNOSIS — Y998 Other external cause status: Secondary | ICD-10-CM | POA: Insufficient documentation

## 2014-12-12 DIAGNOSIS — S73101A Unspecified sprain of right hip, initial encounter: Secondary | ICD-10-CM | POA: Insufficient documentation

## 2014-12-12 DIAGNOSIS — Z720 Tobacco use: Secondary | ICD-10-CM | POA: Insufficient documentation

## 2014-12-12 DIAGNOSIS — S79911A Unspecified injury of right hip, initial encounter: Secondary | ICD-10-CM | POA: Diagnosis present

## 2014-12-12 DIAGNOSIS — Z791 Long term (current) use of non-steroidal anti-inflammatories (NSAID): Secondary | ICD-10-CM | POA: Diagnosis not present

## 2014-12-12 DIAGNOSIS — Y9289 Other specified places as the place of occurrence of the external cause: Secondary | ICD-10-CM | POA: Insufficient documentation

## 2014-12-12 DIAGNOSIS — Z87442 Personal history of urinary calculi: Secondary | ICD-10-CM | POA: Diagnosis not present

## 2014-12-12 DIAGNOSIS — W010XXA Fall on same level from slipping, tripping and stumbling without subsequent striking against object, initial encounter: Secondary | ICD-10-CM | POA: Insufficient documentation

## 2014-12-12 DIAGNOSIS — S339XXA Sprain of unspecified parts of lumbar spine and pelvis, initial encounter: Secondary | ICD-10-CM | POA: Diagnosis not present

## 2014-12-12 DIAGNOSIS — Y9389 Activity, other specified: Secondary | ICD-10-CM | POA: Diagnosis not present

## 2014-12-12 DIAGNOSIS — S335XXA Sprain of ligaments of lumbar spine, initial encounter: Secondary | ICD-10-CM

## 2014-12-12 MED ORDER — MORPHINE SULFATE (PF) 2 MG/ML IV SOLN
0.5000 mg | INTRAVENOUS | Status: DC | PRN
  Administered 2014-12-12: 0.5 mg via INTRAVENOUS
  Filled 2014-12-12: qty 1

## 2014-12-12 MED ORDER — NAPROXEN 500 MG PO TABS: 500.0000 mg | ORAL_TABLET | Freq: Two times a day (BID) | ORAL | Status: DC

## 2014-12-12 MED ORDER — CYCLOBENZAPRINE HCL 5 MG PO TABS: 5.0000 mg | ORAL_TABLET | Freq: Three times a day (TID) | ORAL | Status: DC | PRN

## 2014-12-12 NOTE — ED Notes (Signed)
MD at bedside. 

## 2014-12-12 NOTE — ED Notes (Addendum)
Fall today and landed on right hip. C/o pain to right leg and lower back.  C/o right leg feeling tingly.  Good dp pulse.

## 2014-12-12 NOTE — Discharge Instructions (Signed)
Hip Pain Your hip is the joint between your upper legs and your lower pelvis. The bones, cartilage, tendons, and muscles of your hip joint perform a lot of work each day supporting your body weight and allowing you to move around. Hip pain can range from a minor ache to severe pain in one or both of your hips. Pain may be felt on the inside of the hip joint near the groin, or the outside near the buttocks and upper thigh. You may have swelling or stiffness as well.  HOME CARE INSTRUCTIONS   Take medicines only as directed by your health care provider.  Apply ice to the injured area:  Put ice in a plastic bag.  Place a towel between your skin and the bag.  Leave the ice on for 15-20 minutes at a time, 3-4 times a day.  Keep your leg raised (elevated) when possible to lessen swelling.  Avoid activities that cause pain.  Follow specific exercises as directed by your health care provider.  Sleep with a pillow between your legs on your most comfortable side.  Record how often you have hip pain, the location of the pain, and what it feels like. SEEK MEDICAL CARE IF:   You are unable to put weight on your leg.  Your hip is red or swollen or very tender to touch.  Your pain or swelling continues or worsens after 1 week.  You have increasing difficulty walking.  You have a fever. SEEK IMMEDIATE MEDICAL CARE IF:   You have fallen.  You have a sudden increase in pain and swelling in your hip. MAKE SURE YOU:   Understand these instructions.  Will watch your condition.  Will get help right away if you are not doing well or get worse. Document Released: 09/19/2009 Document Revised: 08/16/2013 Document Reviewed: 11/26/2012 [facility] Patient Information 2015 [place], [place]. This information is not intended to replace advice given to you by your health care provider. Make sure you discuss any questions you have with your health care provider.  Low Back Sprain  A sprain is an injury  in which a ligament is torn. The ligaments of the lower back are vulnerable to sprains. However, they are strong and require great force to be injured. These ligaments are important for stabilizing the spinal column. Sprains are classified into three categories. Grade 1 sprains cause pain, but the tendon is not lengthened. Grade 2 sprains include a lengthened ligament, due to the ligament being stretched or partially ruptured. With grade 2 sprains there is still function, although the function may be decreased. Grade 3 sprains involve a complete tear of the tendon or muscle, and function is usually impaired. SYMPTOMS   Severe pain in the lower back.  Sometimes, a feeling of a "pop," "snap," or tear, at the time of injury.  Tenderness and sometimes swelling at the injury site.  Uncommonly, bruising (contusion) within 48 hours of injury.  Muscle spasms in the back. CAUSES  Low back sprains occur when a force is placed on the ligaments that is greater than they can handle. Common causes of injury include:  Performing a stressful act while off-balance.  Repetitive stressful activities that involve movement of the lower back.  Direct hit (trauma) to the lower back. RISK INCREASES WITH:  Contact sports (football, wrestling).  Collisions (major skiing accidents).  Sports that require throwing or lifting (baseball, weightlifting).  Sports involving twisting of the spine (gymnastics, diving, tennis, golf).  Poor strength and flexibility.  Inadequate  protection.  Previous back injury or surgery (especially fusion). PREVENTION  Wear properly fitted and padded protective equipment.  Warm up and stretch properly before activity.  Allow for adequate recovery between workouts.  Maintain physical fitness:  Strength, flexibility, and endurance.  Cardiovascular fitness.  Maintain a healthy body weight. PROGNOSIS  If treated properly, low back sprains usually heal with non-surgical  treatment. The length of time for healing depends on the severity of the injury.  RELATED COMPLICATIONS   Recurring symptoms, resulting in a chronic problem.  Chronic inflammation and pain in the low back.  Delayed healing or resolution of symptoms, especially if activity is resumed too soon.  Prolonged impairment.  Unstable or arthritic joints of the low back. TREATMENT  Treatment first involves the use of ice and medicine, to reduce pain and inflammation. The use of strengthening and stretching exercises may help reduce pain with activity. These exercises may be performed at home or with a therapist. Severe injuries may require referral to a therapist for further evaluation and treatment, such as ultrasound. Your caregiver may advise that you wear a back brace or corset, to help reduce pain and discomfort. Often, prolonged bed rest results in greater harm then benefit. Corticosteroid injections may be recommended. However, these should be reserved for the most serious cases. It is important to avoid using your back when lifting objects. At night, sleep on your back on a firm mattress, with a pillow placed under your knees. If non-surgical treatment is unsuccessful, surgery may be needed.  MEDICATION   If pain medicine is needed, nonsteroidal anti-inflammatory medicines (aspirin and ibuprofen), or other minor pain relievers (acetaminophen), are often advised.  Do not take pain medicine for 7 days before surgery.  Prescription pain relievers may be given, if your caregiver thinks they are needed. Use only as directed and only as much as you need.  Ointments applied to the skin may be helpful.  Corticosteroid injections may be given by your caregiver. These injections should be reserved for the most serious cases, because they may only be given a certain number of times. HEAT AND COLD  Cold treatment (icing) should be applied for 10 to 15 minutes every 2 to 3 hours for inflammation and  pain, and immediately after activity that aggravates your symptoms. Use ice packs or an ice massage.  Heat treatment may be used before performing stretching and strengthening activities prescribed by your caregiver, physical therapist, or athletic trainer. Use a heat pack or a warm water soak. SEEK MEDICAL CARE IF:   Symptoms get worse or do not improve in 2 to 4 weeks, despite treatment.  You develop numbness or weakness in either leg.  You lose bowel or bladder function.  Any of the following occur after surgery: fever, increased pain, swelling, redness, drainage of fluids, or bleeding in the affected area.  New, unexplained symptoms develop. (Drugs used in treatment may produce side effects.)

## 2014-12-12 NOTE — ED Provider Notes (Signed)
CSN: [id number]     Arrival date & time 12/12/14  1119 History   First MD Initiated Contact with Patient 12/12/14 1121     Chief Complaint  Patient presents with  . Fall   HPI  Manuel Allen is a 34yo male presenting today following a fall and landing on his right hip. States he slipped on saw dust and fell on concrete. Notes right leg and right lower back pain. Notes numbness over back, but states this has been present for years and has been worked up. Initially denied any other numbness, but then states he has numbness down the lateral portion of his leg and into his foot. States he is unable to move his right leg or his foot.  Recently seen in ED on 8/15 with left knee pain after he stepped into a hole while cutting grass. He was placed in a knee immobilizer and instructed to follow up with orthopedics.  Past Medical History  Diagnosis Date  . Back pain   . Renal disorder     kidney stones   Past Surgical History  Procedure Laterality Date  . Arm surgery      Open treatment internal fixation left forearm radius plate   Family History  Problem Relation Age of Onset  . Hypertension Mother   . Cancer Other   . Diabetes Other    Social History  Substance Use Topics  . Smoking status: Current Every Day Smoker -- 1.00 packs/day for 23 years    Types: Cigarettes  . Smokeless tobacco: Current User    Types: Chew  . Alcohol Use: No    Review of Systems  Musculoskeletal: Positive for back pain and arthralgias.      Allergies  Codeine  Home Medications   Prior to Admission medications   Medication Sig Start Date End Date Taking? Authorizing Provider  cephALEXin (KEFLEX) 500 MG capsule Take 1 capsule (500 mg total) by mouth 4 (four) times daily. Patient not taking: Reported on 10/25/2014 09/08/14   Bethann Berkshire, MD  diclofenac (VOLTAREN) 75 MG EC tablet Take 1 tablet (75 mg total) by mouth 2 (two) times daily. Take with food Patient not taking: Reported on 09/08/2014 07/11/14    [name], PA-C  naproxen (NAPROSYN) 500 MG tablet Take 1 tablet (500 mg total) by mouth 2 (two) times daily. 10/25/14   Burgess Amor, PA-C  traMADol (ULTRAM) 50 MG tablet Take 1 tablet (50 mg total) by mouth every 6 (six) hours as needed. 10/25/14   Burgess Amor, PA-C   BP 125/74 mmHg  Pulse 73  Temp(Src) 97.5 F (36.4 C) (Oral)  Resp 13  Ht 6' (1.829 m)  Wt 220 lb (99.791 kg)  BMI 29.83 kg/m2  SpO2 100% Physical Exam  Constitutional: He is oriented to person, place, and time. He appears well-developed and well-nourished. No distress.  HENT:  Head: Normocephalic and atraumatic.  Neck: Normal range of motion. Neck supple.  Cardiovascular: Normal rate and regular rhythm.  Exam reveals no gallop and no friction rub.   No murmur heard. Pulmonary/Chest: Effort normal. No respiratory distress. He has no wheezes.  Abdominal: Soft. Bowel sounds are normal. He exhibits no distension. There is no tenderness.  Musculoskeletal:  Tenderness over right lower back with tight paraspinal muscles noted. No midline tenderness noted. Tenderness over right hip over greater trochanter.   Neurological: He is alert and oriented to person, place, and time. No cranial nerve deficit.  Skin: Skin is warm and dry. No rash  noted.  Psychiatric: He has a normal mood and affect. His behavior is normal.    ED Course  Procedures (including critical care time) Labs Review Labs Reviewed - No data to display  Imaging Review Dg Lumbar Spine Complete  12/12/2014   CLINICAL DATA:  Larey Seat today and injured back and right hip.  EXAM: DG HIP (WITH OR WITHOUT PELVIS) 2-3V RIGHT; LUMBAR SPINE - COMPLETE 4+ VIEW  COMPARISON:  CT scan 09/10/2014  FINDINGS: Right hip and pelvis:  Both hips are normally located. No acute fracture or plain film evidence of avascular necrosis. The pubic symphysis and SI joints are intact. No pelvic fractures are identified.  Lumbar spine:  Normal alignment of the lumbar vertebral bodies. Disc spaces  and vertebral bodies are maintained. No acute fracture. Mild degenerative changes. No pars defects. Stable wedge like deformities of the lower thoracic vertebral bodies and age advanced degenerative changes.  IMPRESSION: 1. 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 2. No acute pelvic or hip fracture.   Electronically Signed   By: Rudie Meyer M.D.   On: 12/12/2014 12:34   Dg Hip Unilat With Pelvis 2-3 Views Right  12/12/2014   CLINICAL DATA:  Larey Seat today and injured back and right hip.  EXAM: DG HIP (WITH OR WITHOUT PELVIS) 2-3V RIGHT; LUMBAR SPINE - COMPLETE 4+ VIEW  COMPARISON:  CT scan 09/10/2014  FINDINGS: Right hip and pelvis:  Both hips are normally located. No acute fracture or plain film evidence of avascular necrosis. The pubic symphysis and SI joints are intact. No pelvic fractures are identified.  Lumbar spine:  Normal alignment of the lumbar vertebral bodies. Disc spaces and vertebral bodies are maintained. No acute fracture. Mild degenerative changes. No pars defects. Stable wedge like deformities of the lower thoracic vertebral bodies and age advanced degenerative changes.  IMPRESSION: 1. 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 2. No acute pelvic or hip fracture.   Electronically Signed   By: Rudie Meyer M.D.   On: 12/12/2014 12:34   I have personally reviewed and evaluated these images and lab results as part of my medical decision-making.   EKG Interpretation None      MDM   Final diagnoses:  None  Xray of hip and lumbar spine without fracture or dislocation. Morphine 0.5mg  given for pain with some relief. Able to ambulate prior to discharge. Stable for discharge with PCP follow up. Prescription for Ibuprofen and Flexeril given.    [place], [place] 12/12/14 1312  Geoffery Lyons, MD 12/12/14 [phone]

## 2014-12-19 ENCOUNTER — Encounter (HOSPITAL_COMMUNITY): Payer: Self-pay

## 2014-12-19 ENCOUNTER — Emergency Department (HOSPITAL_COMMUNITY)
Admission: EM | Admit: 2014-12-19 | Discharge: 2014-12-19 | Disposition: A | Discharge status: Home / Self Care | Payer: Medicare Other | Attending: Emergency Medicine | Admitting: Emergency Medicine

## 2014-12-19 DIAGNOSIS — M545 Low back pain: Secondary | ICD-10-CM

## 2014-12-19 DIAGNOSIS — Z791 Long term (current) use of non-steroidal anti-inflammatories (NSAID): Secondary | ICD-10-CM | POA: Insufficient documentation

## 2014-12-19 DIAGNOSIS — Z720 Tobacco use: Secondary | ICD-10-CM | POA: Insufficient documentation

## 2014-12-19 DIAGNOSIS — R531 Weakness: Secondary | ICD-10-CM | POA: Insufficient documentation

## 2014-12-19 DIAGNOSIS — Z87442 Personal history of urinary calculi: Secondary | ICD-10-CM | POA: Insufficient documentation

## 2014-12-19 DIAGNOSIS — R200 Anesthesia of skin: Secondary | ICD-10-CM | POA: Diagnosis not present

## 2014-12-19 DIAGNOSIS — M5440 Lumbago with sciatica, unspecified side: Secondary | ICD-10-CM | POA: Insufficient documentation

## 2014-12-19 MED ORDER — MORPHINE SULFATE (PF) 4 MG/ML IV SOLN
4.0000 mg | Freq: Once | INTRAVENOUS | Status: AC
  Administered 2014-12-19: 4 mg via INTRAMUSCULAR
  Filled 2014-12-19: qty 1

## 2014-12-19 MED ORDER — KETOROLAC TROMETHAMINE 60 MG/2ML IM SOLN
60.0000 mg | Freq: Once | INTRAMUSCULAR | Status: AC
  Administered 2014-12-19: 60 mg via INTRAMUSCULAR
  Filled 2014-12-19: qty 2

## 2014-12-19 MED ORDER — OXYCODONE HCL 5 MG PO TABS: 5.0000 mg | ORAL_TABLET | ORAL | Status: DC | PRN

## 2014-12-19 MED ORDER — DIAZEPAM 5 MG PO TABS
5.0000 mg | ORAL_TABLET | Freq: Once | ORAL | Status: AC
  Administered 2014-12-19: 5 mg via ORAL
  Filled 2014-12-19: qty 1

## 2014-12-19 NOTE — ED Notes (Signed)
Pt states he was here Monday with back pain. States he was doing yard work yesterday and felt something pop in his low back. Pt complain of low back pain and numbness in both legs. Pt screaming when moved from EMS stretcher to ED stretcher

## 2014-12-19 NOTE — ED Provider Notes (Signed)
CSN: [id number]     Arrival date & time 12/19/14  1342 History   First MD Initiated Contact with Patient 12/19/14 1404     Chief Complaint  Patient presents with  . Back Pain     (Consider location/radiation/quality/duration/timing/severity/associated sxs/prior Treatment) Patient is a 34 y.o. male presenting with back pain. The history is provided by the patient.  Back Pain Location:  Lumbar spine Quality:  Shooting and stabbing Radiates to:  Does not radiate Pain severity:  Severe Pain is:  Same all the time Onset quality:  Sudden Duration:  2 hours Timing:  Constant Progression:  Worsening Chronicity:  Recurrent Context: twisting   Relieved by:  Nothing Worsened by:  Nothing tried Ineffective treatments:  None tried Associated symptoms: numbness (bilateral subjective decreased sensation), tingling and weakness (bilateral, possibly secondary to pain)   Associated symptoms: no abdominal pain, no bladder incontinence, no bowel incontinence, no chest pain, no fever, no headaches and no leg pain   Risk factors: no hx of cancer    34 yo M with a chief complaint of low back pain. The started earlier today when he bent over. Thinks he felt a popping pop in his back. Patient actually had a initial onset with this back pain 6 days ago. Patient had fallen onto some concrete at that time. Was evaluated here had pain improvements at home. Patient was actually feeling much better until today when he bent over. Patient denies any loss of bowel or bladder. Patient denies any loss perirectal sensation. Patient feels that he has decreased sensation to bilateral lower extremities. Able to ambulate initially but then felt like he was doing significant pain with moving and since has not really moved his legs.  Past Medical History  Diagnosis Date  . Back pain   . Renal disorder     kidney stones   Past Surgical History  Procedure Laterality Date  . Arm surgery      Open treatment internal  fixation left forearm radius plate   Family History  Problem Relation Age of Onset  . Hypertension Mother   . Cancer Other   . Diabetes Other    Social History  Substance Use Topics  . Smoking status: Current Every Day Smoker -- 1.00 packs/day for 23 years    Types: Cigarettes  . Smokeless tobacco: Current User    Types: Chew  . Alcohol Use: No    Review of Systems  Constitutional: Negative for fever and chills.  HENT: Negative for congestion and facial swelling.   Eyes: Negative for discharge and visual disturbance.  Respiratory: Negative for shortness of breath.   Cardiovascular: Negative for chest pain and palpitations.  Gastrointestinal: Negative for vomiting, abdominal pain, diarrhea and bowel incontinence.  Genitourinary: Negative for bladder incontinence.  Musculoskeletal: Positive for back pain. Negative for myalgias and arthralgias.  Skin: Negative for color change and rash.  Neurological: Positive for tingling, weakness (bilateral, possibly secondary to pain) and numbness (bilateral subjective decreased sensation). Negative for tremors, syncope and headaches.  Psychiatric/Behavioral: Negative for confusion and dysphoric mood.      Allergies  Codeine  Home Medications   Prior to Admission medications   Medication Sig Start Date End Date Taking? Authorizing Provider  cyclobenzaprine (FLEXERIL) 5 MG tablet Take 1 tablet (5 mg total) by mouth 3 (three) times daily as needed for muscle spasms. 12/12/14  Yes [place] N Rumley, DO  naproxen (NAPROSYN) 500 MG tablet Take 1 tablet (500 mg total) by mouth 2 (two) times  daily. 12/12/14  Yes [place] N Rumley, DO  traMADol (ULTRAM) 50 MG tablet Take 1 tablet (50 mg total) by mouth every 6 (six) hours as needed. 10/25/14  Yes Raynelle Fanning Idol, PA-C  oxyCODONE (ROXICODONE) 5 MG immediate release tablet Take 1 tablet (5 mg total) by mouth every 4 (four) hours as needed for severe pain. 12/19/14   Melene Plan, DO   BP 128/79 mmHg  Pulse 68   Temp(Src) 97.9 F (36.6 C) (Oral)  Resp 16  Ht 6' (1.829 m)  Wt 230 lb (104.327 kg)  BMI 31.19 kg/m2  SpO2 99% Physical Exam  Constitutional: He is oriented to person, place, and time. He appears well-developed and well-nourished.  HENT:  Head: Normocephalic and atraumatic.  Eyes: EOM are normal. Pupils are equal, round, and reactive to light.  Neck: Normal range of motion. Neck supple. No JVD present.  Cardiovascular: Normal rate and regular rhythm.  Exam reveals no gallop and no friction rub.   No murmur heard. Pulmonary/Chest: No respiratory distress. He has no wheezes.  Abdominal: He exhibits no distension. There is no rebound and no guarding.  Musculoskeletal: Normal range of motion. He exhibits tenderness.  TTP worst about the lower l spine with significant spasm. PMS intact distally.  Initially decreased on the left, but improved with encouragement  Neurological: He is alert and oriented to person, place, and time.  Skin: No rash noted. No pallor.  Psychiatric: He has a normal mood and affect. His behavior is normal.    ED Course  Procedures (including critical care time) Labs Review Labs Reviewed - No data to display  Imaging Review No results found. I have personally reviewed and evaluated these images and lab results as part of my medical decision-making.   EKG Interpretation None      MDM   Final diagnoses:  Left low back pain, with sciatica presence unspecified    34 yo M with a chief complaint of low back pain. This is new for this patient according to him since 1 week ago. Had worsening of his pain today. No overt objective findings on physical exam. Will attempt to control the patient's pain with pain medicine.  Pain improved with narcotics Toradol and Valium. Patient suddenly no longer has numbness in bilateral lower extremities. Able to move without difficulty. 5 out of 5 muscle strength.   4:12 PM:  I have discussed the diagnosis/risks/treatment  options with the patient and family and believe the pt to be eligible for discharge home to follow-up with PCP. We also discussed returning to the ED immediately if new or worsening sx occur. We discussed the sx which are most concerning (e.g., cauda equina) that necessitate immediate return. Medications administered to the patient during their visit and any new prescriptions provided to the patient are listed below.  Medications given during this visit Medications  ketorolac (TORADOL) injection 60 mg (60 mg Intramuscular Given 12/19/14 1420)  diazepam (VALIUM) tablet 5 mg (5 mg Oral Given 12/19/14 1420)  morphine 4 MG/ML injection 4 mg (4 mg Intramuscular Given 12/19/14 1420)  morphine 4 MG/ML injection 4 mg (4 mg Intramuscular Given 12/19/14 1550)    New Prescriptions   OXYCODONE (ROXICODONE) 5 MG IMMEDIATE RELEASE TABLET    Take 1 tablet (5 mg total) by mouth every 4 (four) hours as needed for severe pain.     The patient appears reasonably screen and/or stabilized for discharge and I doubt any other medical condition or other [facility] requiring further screening, evaluation,  or treatment in the ED at this time prior to discharge.    Melene Plan, DO 12/19/14 [phone]

## 2014-12-19 NOTE — Discharge Instructions (Signed)
Take 4 over the counter ibuprofen tablets 3 times a day or 2 over-the-counter naproxen tablets twice a day for pain. ° °Back Pain, Adult °Low back pain is very common. About 1 in 5 people have back pain. The cause of low back pain is rarely dangerous. The pain often gets better over time. About half of people with a sudden onset of back pain feel better in just 2 weeks. About 8 in 10 people feel better by 6 weeks.  °CAUSES °Some common causes of back pain include: °· Strain of the muscles or ligaments supporting the spine. °· Wear and tear (degeneration) of the spinal discs. °· Arthritis. °· Direct injury to the back. °DIAGNOSIS °Most of the time, the direct cause of low back pain is not known. However, back pain can be treated effectively even when the exact cause of the pain is unknown. Answering your caregiver's questions about your overall health and symptoms is one of the most accurate ways to make sure the cause of your pain is not dangerous. If your caregiver needs more information, he or she may order lab work or imaging tests (X-rays or MRIs). However, even if imaging tests show changes in your back, this usually does not require surgery. °HOME CARE INSTRUCTIONS °For many people, back pain returns. Since low back pain is rarely dangerous, it is often a condition that people can learn to manage on their own.  °· Remain active. It is stressful on the back to sit or stand in one place. Do not sit, drive, or stand in one place for more than 30 minutes at a time. Take short walks on level surfaces as soon as pain allows. Try to increase the length of time you walk each day. °· Do not stay in bed. Resting more than 1 or 2 days can delay your recovery. °· Do not avoid exercise or work. Your body is made to move. It is not dangerous to be active, even though your back may hurt. Your back will likely heal faster if you return to being active before your pain is gone. °· Pay attention to your body when you  bend and  lift. Many people have less discomfort when lifting if they bend their knees, keep the load close to their bodies, and avoid twisting. Often, the most comfortable positions are those that put less stress on your recovering back. °· Find a comfortable position to sleep. Use a firm mattress and lie on your side with your knees slightly bent. If you lie on your back, put a pillow under your knees. °· Only take over-the-counter or prescription medicines as directed by your caregiver. Over-the-counter medicines to reduce pain and inflammation are often the most helpful. Your caregiver may prescribe muscle relaxant drugs. These medicines help dull your pain so you can more quickly return to your normal activities and healthy exercise. °· Put ice on the injured area. °¨ Put ice in a plastic bag. °¨ Place a towel between your skin and the bag. °¨ Leave the ice on for 15-20 minutes, 03-04 times a day for the first 2 to 3 days. After that, ice and heat may be alternated to reduce pain and spasms. °· Ask your caregiver about trying back exercises and gentle massage. This may be of some benefit. °· Avoid feeling anxious or stressed. Stress increases muscle tension and can worsen back pain. It is important to recognize when you are anxious or stressed and learn ways to manage it. Exercise is a great option. °SEEK MEDICAL CARE IF: °· You have pain that is not relieved with rest or medicine. °· You   have pain that does not improve in 1 week. °· You have new symptoms. °· You are generally not feeling well. °SEEK IMMEDIATE MEDICAL CARE IF:  °· You have pain that radiates from your back into your legs. °· You develop new bowel or bladder control problems. °· You have unusual weakness or numbness in your arms or legs. °· You develop nausea or vomiting. °· You develop abdominal pain. °· You feel faint. °Document Released: 04/01/2005 Document Revised: 10/01/2011 Document Reviewed: 08/03/2013 °Exit[employer]® Patient Information ©2015 [employer]  [employer]. This information is not intended to replace advice given to you by your health care provider. Make sure you discuss any questions you have with your health care provider. ° °

## 2014-12-23 ENCOUNTER — Emergency Department (HOSPITAL_COMMUNITY)
Admission: EM | Admit: 2014-12-23 | Discharge: 2014-12-23 | Disposition: A | Discharge status: Home / Self Care | Payer: Medicare Other | Attending: Emergency Medicine | Admitting: Emergency Medicine

## 2014-12-23 ENCOUNTER — Emergency Department (HOSPITAL_COMMUNITY): Payer: Medicare Other

## 2014-12-23 ENCOUNTER — Encounter (HOSPITAL_COMMUNITY): Payer: Self-pay | Admitting: Emergency Medicine

## 2014-12-23 DIAGNOSIS — R52 Pain, unspecified: Secondary | ICD-10-CM

## 2014-12-23 DIAGNOSIS — Z720 Tobacco use: Secondary | ICD-10-CM | POA: Insufficient documentation

## 2014-12-23 DIAGNOSIS — M5126 Other intervertebral disc displacement, lumbar region: Secondary | ICD-10-CM | POA: Diagnosis not present

## 2014-12-23 DIAGNOSIS — M5440 Lumbago with sciatica, unspecified side: Secondary | ICD-10-CM | POA: Diagnosis not present

## 2014-12-23 DIAGNOSIS — M5441 Lumbago with sciatica, right side: Secondary | ICD-10-CM

## 2014-12-23 DIAGNOSIS — M5442 Lumbago with sciatica, left side: Secondary | ICD-10-CM

## 2014-12-23 DIAGNOSIS — Z87442 Personal history of urinary calculi: Secondary | ICD-10-CM | POA: Diagnosis not present

## 2014-12-23 DIAGNOSIS — M545 Low back pain: Secondary | ICD-10-CM | POA: Diagnosis present

## 2014-12-23 MED ORDER — OXYCODONE-ACETAMINOPHEN 5-325 MG PO TABS: ORAL_TABLET | ORAL | Status: DC

## 2014-12-23 MED ORDER — NAPROXEN 250 MG PO TABS: 250.0000 mg | ORAL_TABLET | Freq: Two times a day (BID) | ORAL | Status: DC | PRN

## 2014-12-23 MED ORDER — METHOCARBAMOL 500 MG PO TABS: 1000.0000 mg | ORAL_TABLET | Freq: Four times a day (QID) | ORAL | Status: DC | PRN

## 2014-12-23 MED ORDER — FENTANYL CITRATE (PF) 100 MCG/2ML IJ SOLN
100.0000 ug | Freq: Once | INTRAMUSCULAR | Status: AC
  Administered 2014-12-23: 100 ug via INTRAMUSCULAR
  Filled 2014-12-23: qty 2

## 2014-12-23 NOTE — ED Provider Notes (Signed)
CSN: [id number]     Arrival date & time 12/23/14  1241 History   First MD Initiated Contact with Patient 12/23/14 1332     Chief Complaint  Patient presents with  . Leg Pain  . Back Pain     HPI Pt was seen at 1340.  Per pt, c/o gradual onset and persistence of constant low back "pain" for the past 2 weeks. Pt states his LBP began after he "slipped and fell onto my right hip" 2 weeks ago.  Pain worsens with palpation of the area and body position changes. Pt c/o "numbness" in his pelvis, hips, and bilat LE's since the injury. States his LLE "feels swollen." Denies incont/retention of bowel or bladder, no focal motor weakness, no fevers, no new injury, no abd pain.   The symptoms have been associated with no other complaints. The patient has a significant history of similar symptoms previously, recently being evaluated for this complaint and multiple prior evals for same. Pt was evaluated in the ED x2 previously for this complaint.     Past Medical History  Diagnosis Date  . Back pain   . Renal disorder     kidney stones   Past Surgical History  Procedure Laterality Date  . Arm surgery      Open treatment internal fixation left forearm radius plate   Family History  Problem Relation Age of Onset  . Hypertension Mother   . Cancer Other   . Diabetes Other    Social History  Substance Use Topics  . Smoking status: Current Every Day Smoker -- 1.00 packs/day for 23 years    Types: Cigarettes  . Smokeless tobacco: Current User    Types: Chew  . Alcohol Use: No    Review of Systems ROS: Statement: All systems negative except as marked or noted in the HPI; Constitutional: Negative for fever and chills. ; ; Eyes: Negative for eye pain, redness and discharge. ; ; ENMT: Negative for ear pain, hoarseness, nasal congestion, sinus pressure and sore throat. ; ; Cardiovascular: Negative for chest pain, palpitations, diaphoresis, dyspnea and peripheral edema. ; ; Respiratory: Negative for  cough, wheezing and stridor. ; ; Gastrointestinal: Negative for nausea, vomiting, diarrhea, abdominal pain, blood in stool, hematemesis, jaundice and rectal bleeding. . ; ; Genitourinary: Negative for dysuria, flank pain and hematuria. ; ; Musculoskeletal: +LBP. Negative for neck pain. Negative for swelling and trauma.; ; Skin: Negative for pruritus, rash, abrasions, blisters, bruising and skin lesion.; ; Neuro: +paresthesias. Negative for headache, lightheadedness and neck stiffness. Negative for weakness, altered level of consciousness , altered mental status, extremity weakness, involuntary movement, seizure and syncope.      Allergies  Codeine  Home Medications   Prior to Admission medications   Medication Sig Start Date End Date Taking? Authorizing Provider  naproxen (NAPROSYN) 500 MG tablet Take 1 tablet (500 mg total) by mouth 2 (two) times daily. 12/12/14  Yes [place] N Rumley, DO  oxyCODONE (ROXICODONE) 5 MG immediate release tablet Take 1 tablet (5 mg total) by mouth every 4 (four) hours as needed for severe pain. 12/19/14  Yes Melene Plan, DO  cyclobenzaprine (FLEXERIL) 5 MG tablet Take 1 tablet (5 mg total) by mouth 3 (three) times daily as needed for muscle spasms. Patient not taking: Reported on 12/23/2014 12/12/14   Lora Havens Rumley, DO  traMADol (ULTRAM) 50 MG tablet Take 1 tablet (50 mg total) by mouth every 6 (six) hours as needed. Patient not taking: Reported on 12/23/2014 10/25/14  Burgess Amor, PA-C   BP 132/87 mmHg  Pulse 67  Temp(Src) 97.5 F (36.4 C) (Oral)  Resp 18  Ht 6' (1.829 m)  Wt 230 lb (104.327 kg)  BMI 31.19 kg/m2  SpO2 97% Physical Exam  1345: Physical examination:  Nursing notes reviewed; Vital signs and O2 SAT reviewed;  Constitutional: Well developed, Well nourished, Well hydrated, In no acute distress; Head:  Normocephalic, atraumatic; Eyes: EOMI, PERRL, No scleral icterus; ENMT: Mouth and pharynx normal, Mucous membranes moist; Neck: Supple, Full range of  motion, No lymphadenopathy; Cardiovascular: Regular rate and rhythm, No murmur, rub, or gallop; Respiratory: Breath sounds clear & equal bilaterally, No rales, rhonchi, wheezes.  Speaking full sentences with ease, Normal respiratory effort/excursion; Chest: Nontender, Movement normal; Abdomen: Soft, Nontender, Nondistended, Normal bowel sounds. Rectal exam performed w/permission of pt and ED RN chaperone present.  Anal tone normal.  Non-tender, soft brown stool in rectal vault.  No fissures, no external hemorrhoids, no palp masses.; Genitourinary: No CVA tenderness; Spine:  No midline CS, TS, LS tenderness. +TTP R>L lumbar paraspinal muscles.;; Extremities: Pulses normal, No tenderness, No edema, No calf edema or asymmetry.; Neuro: AA&Ox3, Major CN grossly intact.  Speech clear. Strength 5/5 equal bilat UE's and LE's, including great toe dorsiflexion.  DTR 2/4 equal bilat UE's and LE's.  No gross sensory deficits. ; Skin: Color normal, Warm, Dry.   ED Course  Procedures (including critical care time)  Labs Review   Imaging Review  I have personally reviewed and evaluated these images and lab results as part of my medical decision-making.   EKG Interpretation None      MDM  MDM Reviewed: previous chart, nursing note and vitals Interpretation: MRI and ultrasound    Mr Lumbar Spine Wo Contrast 12/23/2014   CLINICAL DATA:  Status post fall landing on the right hip.  EXAM: MRI LUMBAR SPINE WITHOUT CONTRAST  TECHNIQUE: Multiplanar, multisequence MR imaging of the lumbar spine was performed. No intravenous contrast was administered.  COMPARISON:  None.  FINDINGS: The vertebral bodies of the lumbar spine are normal in size. The vertebral bodies of the lumbar spine are normal in alignment. There is normal bone marrow signal demonstrated throughout the vertebra. There is mild disc desiccation throughout the lumbar spine. There is mild disc height loss at L5-S1.  The spinal cord is normal in signal and  contour. The cord terminates normally at L1 . The nerve roots of the cauda equina and the filum terminale are normal.  The visualized portions of the SI joints are unremarkable.  The imaged intra-abdominal contents are unremarkable.  T12-L1: No significant disc bulge. No evidence of neural foraminal stenosis. No central canal stenosis.  L1-L2: No significant disc bulge. No evidence of neural foraminal stenosis. No central canal stenosis.  L2-L3: Small left lateral disc protrusion. No evidence of neural foraminal stenosis. No central canal stenosis.  L3-L4: Mild broad-based disc bulge. Mild left foraminal narrowing. No right foraminal narrowing. No central canal stenosis.  L4-L5: Large right paracentral disc protrusion with inferior migration of disc material. There is adjacent 8 x 3 mm intermediate signal material abutting the disc protrusion which may reflect disc material versus a small amount of hemorrhage. There is a possible small sequestered fragment versus further extension of the disc protrusion measuring 4.5 x 7.2 mm along the posterior inferior margin of the L5 vertebral body adjacent to the roof of the right L5-S1 neural foramen. There is bilateral lateral recess stenosis with mass effect on the intraspinal right  L5 nerve root. Mild bilateral foraminal stenosis. Moderate central canal stenosis.  L5-S1: Right paracentral/foraminal disc protrusion which possible impingement of the right intraspinal S1 nerve root. Mild right foraminal stenosis. No left foraminal stenosis. No central canal stenosis.  IMPRESSION: 1. At L4-5 there is a large right paracentral disc protrusion with inferior migration of disc material. There is adjacent 8 x 3 mm intermediate signal material abutting the disc protrusion which may reflect disc material versus a small amount of hemorrhage. There is a possible small sequestered fragment versus further extension of the disc protrusion measuring 4.5 x 7.2 mm along the posterior inferior  margin of the L5 vertebral body adjacent to the roof of the right L5-S1 neural foramen. There is bilateral lateral recess stenosis with mass effect on the intraspinal L5 nerve roots bilaterally. Mild bilateral foraminal stenosis. Moderate central canal stenosis secondary to the disc protrusion. 2. At L5-S1 there is a right paracentral/foraminal disc protrusion which possible impingement of the right intraspinal S1 nerve root. Mild right foraminal stenosis.  At L2-3 there is a small left lateral disc protrusion.   Electronically Signed   By: Elige Ko   On: 12/23/2014 15:27   US Venous Img Lower Unilateral Left 12/23/2014   CLINICAL DATA:  34 year old male with left lower extremity swelling  EXAM: LEFT LOWER EXTREMITY VENOUS DUPLEX ULTRASOUND  TECHNIQUE: Gray-scale sonography with graded compression, as well as color Doppler and duplex ultrasound were performed to evaluate the lower extremity deep venous systems from the level of the common femoral vein and including the common femoral, femoral, profunda femoral, popliteal and calf veins including the posterior tibial, peroneal and gastrocnemius veins when visible. The superficial great saphenous vein was also interrogated. Spectral Doppler was utilized to evaluate flow at rest and with distal augmentation maneuvers in the common femoral, femoral and popliteal veins.  COMPARISON:  None.  FINDINGS: Contralateral Common Femoral Vein: Respiratory phasicity is normal and symmetric with the symptomatic side. No evidence of thrombus. Normal compressibility.  Common Femoral Vein: No evidence of thrombus. Normal compressibility, respiratory phasicity and response to augmentation.  Saphenofemoral Junction: No evidence of thrombus. Normal compressibility and flow on color Doppler imaging.  Profunda Femoral Vein: No evidence of thrombus. Normal compressibility and flow on color Doppler imaging.  Femoral Vein: No evidence of thrombus. Normal compressibility, respiratory  phasicity and response to augmentation.  Popliteal Vein: No evidence of thrombus. Normal compressibility, respiratory phasicity and response to augmentation.  Calf Veins: No evidence of thrombus. Normal compressibility and flow on color Doppler imaging.  Superficial Great Saphenous Vein: No evidence of thrombus. Normal compressibility and flow on color Doppler imaging.  Venous Reflux:  None.  Other Findings:  None.  IMPRESSION: No evidence of deep venous thrombosis.   Electronically Signed   By: Malachy Moan M.D.   On: 12/23/2014 14:26    1550:  T/C from Neurosurgery office: they will have Dr. Yetta Barre call me back after he reviews the MRI.  1635: T/C from Neurosurgery Dr. Yetta Barre, case discussed, including:  HPI, pertinent PM/SHx, VS/PE, dx testing, ED course and treatment:  He has reviewed the MRI, states pt has herniated disk, he does not need emergent surgery today or this weekend, but will need surgery soon, have pt come to his office on Monday morning at 0830am, Dr. Yetta Barre will have the office call pt to confirm; also requests to tell pt if he has any issues over the weekend, he should go to [facility] for evaluation since that is where  Neurosurgery is located. Dx and testing, as well as d/w pt and his mother, d/w pt and family.  Questions answered.  Verb understanding, agreeable to d/c home with outpt f/u.        Samuel Jester, DO 12/27/14 1556

## 2014-12-23 NOTE — Discharge Instructions (Signed)
°Emergency Department Resource Guide °1) Find a Doctor and Pay Out of Pocket °Although you won't have to find out who is covered by your insurance plan, it is a good idea to ask around and get recommendations. You will then need to call the office and see if the doctor you have chosen will accept you as a new patient and what types of options they offer for patients who are self-pay. Some doctors offer discounts or will set up payment plans for their patients who do not have insurance, but you will need to ask so you aren't surprised when you get to your appointment. ° °2) Contact Your Local Health Department °Not all health departments have doctors that can see patients for sick visits, but many do, so it is worth a call to see if yours does. If you don't know where your local health department is, you can check in your phone book. The CDC also has a tool to help you locate your state's health department, and many state websites also have listings of all of their local health departments. ° °3) Find a Walk-in Clinic °If your illness is not likely to be very severe or complicated, you may want to try a walk in clinic. These are popping up all over the country in pharmacies, drugstores, and shopping centers. They're usually staffed by nurse practitioners or physician assistants that have been trained to treat common illnesses and complaints. They're usually fairly quick and inexpensive. However, if you have serious medical issues or chronic medical problems, these are probably not your best option. ° °No Primary Care Doctor: °- Call [facility] Connect at  832-8000 - they can help you locate a primary care doctor that  accepts your insurance, provides certain services, etc. °- Physician Referral Service- 1-800-533-3463 ° °Chronic Pain Problems: °Organization         Address  Phone   Notes  °[facility]  (336) 297-2271 Patients need to be referred by their primary care doctor.  ° °Medication  Assistance: °Organization         Address  Phone   Notes  °[place] [facility] [address] °[place], [place] [address] (336) 641-8030 --Must be a resident of [place] °-- Must have NO insurance coverage whatsoever (no Medicaid/ Medicare, etc.) °-- The pt. MUST have a primary care doctor that directs their care regularly and follows them in the community °  °[facility]  (866) 331-1348   °[employer]  (888) 892-1162   ° °Agencies that provide inexpensive medical care: °Organization         Address  Phone   Notes  °[facility]  (336) 832-8035   °[facility]    (336) 832-7272   °[facility] [address] °[place], [place] [address] (336) 832-4777   °[facility] [address], °[place] (336) 271-4999   °[facility]    (336) 373-0678   °[facility]    (336) 272-1050   °[facility] ° [address], [place] Phone:  (336) 832-4444, Fax:  (336) 832-4440 Hours of Operation:  9 am - 6 pm, M-F.  Also accepts Medicaid/Medicare and self-pay.  °[facility] ° [address], [place] Phone: (336) 832-3150, Fax: (336) 832-3151. Hours of Operation:  8:30 am - 5:30 pm, M-F.  Also accepts Medicaid and self-pay.  °[facility] [place] [address], [place] Phone: (336) 878-6027   °[facility] [address], [place], [place] (336)723-1848, Ext. 123 Mondays & Thursdays: 7-9 AM.  First 15 patients are seen on a first come, first serve basis. °  ° °Medicaid-accepting [place] Providers: ° °Organization         Address  Phone   Notes  °[facility] [address], [place] (336) 641-2100 Also accepts self-pay patients.  °[facility] [address], [place] ° (336) 856-9996   °[facility] [address], [place]  (336) 288-8857   °[facility] [address] [place] [address], [place] (336) 299-7000   °Veita Bland [address], [place]  ° (336) 373-1557 Only accepts [place] Access Medicaid patients after they have their name applied to their card.  ° °Self-Pay (no insurance) in [place]: ° °Organization         Address  Phone   Notes  °Sickle Cell Patients, [facility] [address], [place] (336) 832-1970   °[facility] [address], [place] (336) 832-4400   °[facility] ° [address], [place] (336) 992-4800   °[facility]/Dr. Osei-Bonsu ° [address] [place] [address], [place] or [address], [place] (336) 841-8500 Phone number for both [place] and [place] locations is the same.  °[facility] [address], [place] (336) 299-0000   °[facility] [address] [place] [address], [place] or [address] (336) 852-7530 °(336) 878-2260   °[facility] [address], [place] (336) 350-1642, phone; (336) 294-5005, fax Sees patients 1st and 3rd Saturday of every month.  Must not qualify for public or private insurance (i.e. Medicaid, Medicare, [place] Health Choice, Veterans' Benefits) • Household income should be no more than 200% of the poverty level •The clinic cannot treat you if you are pregnant or think you are pregnant • Sexually transmitted diseases are not treated at the clinic.  ° ° °Dental Care: °Organization         Address  Phone  Notes  °[place] [facility] [address], [place] (336) 641-6152 Accepts children up to age 21 who are enrolled in Medicaid or [place] Health Choice; pregnant women with a Medicaid card; and children who have applied for Medicaid or [place] Health Choice, but were declined, whose parents can pay a reduced fee at time of service.  °[place]  [facility] [place]  [address], [place] (336) 641-7733 Accepts children up to age 21 who are enrolled in Medicaid or [place] Health Choice; pregnant women with a Medicaid card; and children who have applied for Medicaid or [place] Health Choice, but were declined, whose parents can pay a reduced fee at time of service.  °[facility] ° [address], [place] (336) 641-4533 Patients are seen by appointment only. Walk-ins are not accepted. [facility] will see patients 18 years of age and older. °Monday - Tuesday (8am-5pm) °Most Wednesdays (8:30-5pm) °$30 per visit, cash only  °[facility] ° [address], [place] (336) 641-4533 Patients are seen by appointment only. Walk-ins are not accepted. [facility] will see patients 18 years of age and older. °One   Wednesday Evening (Monthly: Volunteer Based).  $30 per visit, cash only  °[facility]  (919) 537-3737 for adults; Children under age 4, call Graduate Pediatric Dentistry at (919) [phone]. Children aged 4-14, please call (919) [phone] to request a pediatric application. ° Dental services are provided in all areas of dental care including fillings, crowns and bridges, complete and partial dentures, implants, gum treatment, root canals, and extractions. Preventive care is also provided. Treatment is provided to both adults and children. °Patients are selected via a lottery and there is often a waiting list. °  °[facility] [address], °[place] ° (336) 763-8833 [email or web address].[email or web address] °  °[facility] [address], [place], [place] (336)723-1848, Ext. 123 Second and Fourth Thursday of each month, opens at 6:30 AM; Clinic ends at 9 AM.  Patients are seen on a first-come first-served basis, and a limited number are seen during each clinic.  ° °[facility] ° [address], [place], [place] (336) 723-7904    Eligibility Requirements °You must have lived in [place], [place], or [place] counties for at least the last three months. °  You cannot be eligible for state or federal sponsored healthcare insurance, including Veterans Administration, Medicaid, or Medicare. °  You generally cannot be eligible for healthcare insurance through your employer.  °  How to apply: °Eligibility screenings are held every Tuesday and Wednesday afternoon from 1:00 pm until 4:00 pm. You do not need an appointment for the interview!  °[facility] [address], [place], [place] 336-631-2330   °[facility]  336-342-8273   °[place]  336-703-3100   °[facility]  336-570-6415   ° °Behavioral Health Resources in the Community: °Intensive Outpatient Programs °Organization         Address  Phone  Notes  °[facility] [address], [place], [place] 336-878-6098   °[facility] [address], [place], [place] 336-832-9800   °[facility] [address], [place], [place] ° 336-882-2125   °[facility] [address],  °[place], [place] 1-800-853-5163 or 336-641-4981   °Substance Abuse Resources °Organization         Address  Phone  Notes  °[facility]  336-882-2125   °[facility]  336-784-9470   °[facility]  336-285-9073   °[facility]  336-845-3988   °Residential & Outpatient Substance Abuse Program  1-800-659-3381   °Psychological Services °Organization         Address  Phone  Notes  °[facility]  336- 832-9600   °[facility]  336- 378-7881   °[facility] [address], [place] 1-800-853-5163 or 336-641-4981   ° °Mobile Crisis Teams °Organization         Address  Phone  Notes  °[facility], Mobile Crisis Care Unit  1-877-626-1772   °Assertive °[facility] ° [address]  [place], [place] 336-834-9664   °Sharon DeEsch [address] °[place] 336-554-5454   ° °Self-Help/Support Groups °Organization         Address  Phone             Notes  °[facility]  - variety of support groups  336- 373-1402 Call for more information  °Narcotics Anonymous (NA), [facility] [address], °[place]  2 meetings at this location  ° °  Residential Treatment Programs Organization         Address  Phone  Notes  [facility] [address],    [place]  1-610-960-4540   [facility]  [address] [place] [address], [place], [place] 478-295-6213   [facility] [address] [place], [place] 086-578-4696 Admissions: 8am-3pm M-F  [facility] [address],    [place], [place] 295-284-1324   [facility] [address] [place], [place], [place] 401-027-2536   [facility] [address]  [place], [place] [phone]   [facility] Intensive Outpatient [address], [place], [place] 956-387-5643   [facility] [address] [place].,  [place], [place] 3-295-188-4166 or 747-460-8528   [facility] ([facility] [address] [place], [place] 323-557-3220 Accepts Medicaid  [facility] [address],  [place] 2-542-706-2376 Substance Abuse/Addiction Treatment   [facility]         Address  Phone  Notes  [facility]  414 538 6442   Angie Fava, PhD [address] Ervin Knack [place], [place]   6144817256 or 581-211-6594   [facility]   [address] [place], [place] 630-065-5573   [facility] [address], [place], [place] (707) 154-7027 Insurance/Medicaid/sponsorship through [facility] and Families [address]                                    [place], [place] (450)038-1325 Therapy/tele-psych/case    [facility] [address], [place] [phone]    Dr. Lolly Mustache  9074972938   [facility]  [employer] [facility] Dept. 1) [address], [place] 2) [address], [place] 3)  [address], [place] ([phone]) [phone] ([phone])[phone]  2281651420   [facility] Child Abuse Hotline 346 018 5888 or 339-795-6105 (After Hours)      Take the prescriptions as directed.  Apply moist heat or ice to the area(s) of discomfort, for 15 minutes at a time, several times per day for the next few days.  Do not fall asleep on a heating or ice pack.  Go Neurosurgeon Dr. Yetta Barre' office on Monday morning at 8:30am to be seen in follow up. He is expecting to see you. He requests you return to the Emergency Department at [facility] immediately sooner if worsening.

## 2014-12-23 NOTE — ED Notes (Signed)
Pt reports falling Monday 8/29, injuring hip & back. [facility]en at [facility] and evaluated. Returned 9/5 to [facility] for continued pain and new onset numbness to hips and BLEs. Given Oxycodone at that time. No change to pain or numbness since then. Back for re-evaluation.

## 2014-12-28 ENCOUNTER — Ambulatory Visit (HOSPITAL_COMMUNITY): Admission: RE | Admit: 2014-12-28 | Payer: Medicare Other | Source: Ambulatory Visit | Admitting: Neurological Surgery

## 2014-12-28 ENCOUNTER — Encounter (HOSPITAL_COMMUNITY): Admission: RE | Payer: Self-pay | Source: Ambulatory Visit

## 2014-12-28 SURGERY — LUMBAR LAMINECTOMY/DECOMPRESSION MICRODISCECTOMY 2 LEVELS
Anesthesia: General | Site: Back | Laterality: Right

## 2015-03-07 ENCOUNTER — Telehealth (HOSPITAL_COMMUNITY): Payer: Self-pay

## 2015-03-07 NOTE — Telephone Encounter (Signed)
Appt canceled ,no transportation they will call us back later to reschedule

## 2015-03-08 ENCOUNTER — Ambulatory Visit (HOSPITAL_COMMUNITY): Payer: Medicare Other | Admitting: Physical Therapy

## 2015-06-14 ENCOUNTER — Other Ambulatory Visit (HOSPITAL_COMMUNITY): Payer: Self-pay | Admitting: Neurological Surgery

## 2015-06-14 DIAGNOSIS — M545 Low back pain: Secondary | ICD-10-CM

## 2015-06-27 ENCOUNTER — Ambulatory Visit (HOSPITAL_COMMUNITY)
Admission: RE | Admit: 2015-06-27 | Discharge: 2015-06-27 | Disposition: A | Discharge status: Home / Self Care | Payer: Medicare Other | Source: Ambulatory Visit | Attending: Neurological Surgery | Admitting: Neurological Surgery

## 2015-06-27 DIAGNOSIS — M545 Low back pain: Secondary | ICD-10-CM

## 2015-07-11 ENCOUNTER — Ambulatory Visit (HOSPITAL_COMMUNITY)
Admission: RE | Admit: 2015-07-11 | Discharge: 2015-07-11 | Disposition: A | Discharge status: Home / Self Care | Payer: Medicare Other | Source: Ambulatory Visit | Attending: Neurological Surgery | Admitting: Neurological Surgery

## 2015-07-11 DIAGNOSIS — Z9889 Other specified postprocedural states: Secondary | ICD-10-CM | POA: Insufficient documentation

## 2015-07-11 DIAGNOSIS — M4806 Spinal stenosis, lumbar region: Secondary | ICD-10-CM | POA: Diagnosis not present

## 2015-07-11 DIAGNOSIS — M5127 Other intervertebral disc displacement, lumbosacral region: Secondary | ICD-10-CM | POA: Insufficient documentation

## 2015-07-11 DIAGNOSIS — M5186 Other intervertebral disc disorders, lumbar region: Secondary | ICD-10-CM | POA: Diagnosis not present

## 2015-07-11 DIAGNOSIS — M5126 Other intervertebral disc displacement, lumbar region: Secondary | ICD-10-CM | POA: Diagnosis not present

## 2015-07-11 DIAGNOSIS — M545 Low back pain: Secondary | ICD-10-CM | POA: Diagnosis present

## 2015-07-11 MED ORDER — GADOBENATE DIMEGLUMINE 529 MG/ML IV SOLN
20.0000 mL | Freq: Once | INTRAVENOUS | Status: AC | PRN
  Administered 2015-07-11: 20 mL via INTRAVENOUS

## 2015-07-21 ENCOUNTER — Other Ambulatory Visit: Payer: Self-pay | Admitting: Neurological Surgery

## 2015-07-22 ENCOUNTER — Telehealth: Payer: Self-pay | Admitting: Vascular Surgery

## 2015-07-22 NOTE — Telephone Encounter (Signed)
-----   Message from Glori BickersStephanie N Dehart, RN sent at 07/21/2015  3:25 PM EDT ----- Regarding: OV with [facility] Mr. [name] is scheduled for an L4-5, L5-S1 ALIF with Drs. Early and Jones on Wednesday, 08/23/15. He will need an OV with Dr. Arbie CookeyEarly at least 2 weeks prior to surgery.   Thanks, Judeth CornfieldStephanie

## 2015-07-22 NOTE — Telephone Encounter (Signed)
sched appt for 08/15/15 at 11. Lm on hm# to inform pt of appt.

## 2015-08-09 ENCOUNTER — Other Ambulatory Visit: Payer: Self-pay

## 2015-08-10 ENCOUNTER — Encounter: Payer: Self-pay | Admitting: Vascular Surgery

## 2015-08-14 ENCOUNTER — Encounter (HOSPITAL_COMMUNITY): Payer: Self-pay

## 2015-08-14 ENCOUNTER — Ambulatory Visit (HOSPITAL_COMMUNITY)
Admission: RE | Admit: 2015-08-14 | Discharge: 2015-08-14 | Disposition: A | Discharge status: Home / Self Care | Payer: Medicare Other | Source: Ambulatory Visit | Attending: Neurological Surgery | Admitting: Neurological Surgery

## 2015-08-14 ENCOUNTER — Encounter (HOSPITAL_COMMUNITY)
Admission: RE | Admit: 2015-08-14 | Discharge: 2015-08-14 | Disposition: A | Discharge status: Home / Self Care | Payer: Medicare Other | Source: Ambulatory Visit | Attending: Neurological Surgery | Admitting: Neurological Surgery

## 2015-08-14 DIAGNOSIS — Z01812 Encounter for preprocedural laboratory examination: Secondary | ICD-10-CM | POA: Insufficient documentation

## 2015-08-14 DIAGNOSIS — Z79899 Other long term (current) drug therapy: Secondary | ICD-10-CM | POA: Diagnosis not present

## 2015-08-14 DIAGNOSIS — Z01810 Encounter for preprocedural cardiovascular examination: Secondary | ICD-10-CM | POA: Diagnosis not present

## 2015-08-14 DIAGNOSIS — M4310 Spondylolisthesis, site unspecified: Secondary | ICD-10-CM

## 2015-08-14 DIAGNOSIS — Z01818 Encounter for other preprocedural examination: Secondary | ICD-10-CM | POA: Diagnosis not present

## 2015-08-14 HISTORY — DX: Nausea with vomiting, unspecified: Z98.890

## 2015-08-14 HISTORY — DX: Family history of other specified conditions: Z84.89

## 2015-08-14 HISTORY — DX: Dysphagia, unspecified: R13.10

## 2015-08-14 HISTORY — DX: Other specified postprocedural states: R11.2

## 2015-08-14 LAB — PROTIME-INR
INR: 1 (ref 0.00–1.49)
Prothrombin Time: 13.4 seconds (ref 11.6–15.2)

## 2015-08-14 LAB — SURGICAL PCR SCREEN
MRSA, PCR: NEGATIVE
STAPHYLOCOCCUS AUREUS: NEGATIVE

## 2015-08-14 LAB — CBC WITH DIFFERENTIAL/PLATELET
BASOS ABS: 0 10*3/uL (ref 0.0–0.1)
Basophils Relative: 0 %
Eosinophils Absolute: 0.4 10*3/uL (ref 0.0–0.7)
Eosinophils Relative: 4 %
HEMATOCRIT: 41.4 % (ref 39.0–52.0)
Hemoglobin: 14 g/dL (ref 13.0–17.0)
LYMPHS ABS: 2.5 10*3/uL (ref 0.7–4.0)
LYMPHS PCT: 29 %
MCH: 29.9 pg (ref 26.0–34.0)
MCHC: 33.8 g/dL (ref 30.0–36.0)
MCV: 88.3 fL (ref 78.0–100.0)
MONOS PCT: 9 %
Monocytes Absolute: 0.8 10*3/uL (ref 0.1–1.0)
NEUTROS ABS: 5 10*3/uL (ref 1.7–7.7)
NEUTROS PCT: 58 %
PLATELETS: 227 10*3/uL (ref 150–400)
RBC: 4.69 MIL/uL (ref 4.22–5.81)
RDW: 13 % (ref 11.5–15.5)
WBC: 8.7 10*3/uL (ref 4.0–10.5)

## 2015-08-14 LAB — BASIC METABOLIC PANEL
ANION GAP: 9 (ref 5–15)
BUN: 10 mg/dL (ref 6–20)
CHLORIDE: 107 mmol/L (ref 101–111)
CO2: 23 mmol/L (ref 22–32)
Calcium: 9.2 mg/dL (ref 8.9–10.3)
Creatinine, Ser: 0.96 mg/dL (ref 0.61–1.24)
GFR calc non Af Amer: >60 mL/min (ref >60)
Glucose, Bld: 98 mg/dL (ref 65–99)
Potassium: 3.9 mmol/L (ref 3.5–5.1)
Sodium: 139 mmol/L (ref 135–145)

## 2015-08-14 LAB — ABO/RH: ABO/RH(D): O POS

## 2015-08-14 LAB — TYPE AND SCREEN
ABO/RH(D): O POS
Antibody Screen: NEGATIVE

## 2015-08-14 NOTE — Pre-Procedure Instructions (Signed)
Manuel MatasBrian L Allen  08/14/2015     Your procedure is scheduled on : Wednesday Aug 23, 2015 at 8:30 AM.  [facility] 6:30 AM.  Call this number if you have problems the morning of surgery: [phone]    Remember:  Do not eat food or drink liquids after midnight.  Take these medicines the morning of surgery with A SIP OF WATER : Oxycodone if needed   Stop taking any vitamins, herbal medications/supplements, NSAIDs, Ibuprofen, Advil, Motrin, Aleve, etc on Wednesday May 3rd   Do not wear jewelry.  Do not wear lotions, powders, or cologne.    Men may shave face and neck.  Do not bring valuables to the hospital.  [facility] is not responsible for any belongings or valuables.  Contacts, dentures or bridgework may not be worn into surgery.  Leave your suitcase in the car.  After surgery it may be brought to your room.  For patients admitted to the hospital, discharge time will be determined by your treatment team.  Patients discharged the day of surgery will not be allowed to drive home.   Name and phone number of your driver:    Special instructions:  Shower using CHG soap the night before and the morning of your surgery  Please read over the following fact sheets that you were given. Pain Booklet, Coughing and Deep Breathing, Blood Transfusion Information, MRSA Information and Surgical Site Infection Prevention

## 2015-08-15 ENCOUNTER — Ambulatory Visit (INDEPENDENT_AMBULATORY_CARE_PROVIDER_SITE_OTHER): Payer: Medicare Other | Admitting: Vascular Surgery

## 2015-08-15 ENCOUNTER — Encounter: Payer: Self-pay | Admitting: Vascular Surgery

## 2015-08-15 VITALS — BP 140/82 | HR 90 | Temp 97.3°F | Resp 16 | Ht 72.0 in | Wt 246.0 lb

## 2015-08-15 DIAGNOSIS — M5137 Other intervertebral disc degeneration, lumbosacral region: Secondary | ICD-10-CM

## 2015-08-15 NOTE — Progress Notes (Signed)
[facility]  Patient name: Manuel Allen MRN: [id number] DOB: [birth year] Sex: male  REASON FOR CONSULT: Discuss anterior exposure for L4-5 and L5-S1 disc surgery  HPI: Manuel Allen is a 35 y.o. male, who is scheduled for 2 level disc surgery with Dr. Marikay Alaravid Jones on 08/23/2015. He is here today for discussion of my role for anterior exposure. He has degenerative disc disease and hurting and stinging down both legs. Has no history of lower extremity arterial insufficiency. No  prior abdominal surgery. He is here today with his mother and.  Past Medical History  Diagnosis Date  . Back pain   . PONV (postoperative nausea and vomiting)   . Family history of adverse reaction to anesthesia     Patients grandmother had a difficult time waking up; patients mother was given to much anesthesia during knee surgery on 09/28/14  . Renal disorder     kidney stones  . Difficulty swallowing     Pt stated "I have difficulty swallowing my spit when I lay down at night"    Family History  Problem Relation Age of Onset  . Hypertension Mother   . Cancer Other   . Diabetes Other     SOCIAL HISTORY: Social History   Social History  . Marital Status: Single    Spouse Name: N/A  . Number of Children: N/A  . Years of Education: N/A   Occupational History  . Not on file.   Social History Main Topics  . Smoking status: Current Every Day Smoker -- 1.50 packs/day for 26 years    Types: Cigarettes  . Smokeless tobacco: Current User    Types: Chew  . Alcohol Use: No  . Drug Use: No  . Sexual Activity: Not on file   Other Topics Concern  . Not on file   Social History Narrative    Allergies  Allergen Reactions  . Codeine Hives    Current Outpatient Prescriptions  Medication Sig Dispense Refill  . oxyCODONE-acetaminophen (PERCOCET) 10-325 MG tablet Take 1-2 tablets by mouth every 6 (six) hours as needed.  0   No current facility-administered medications  for this visit.    REVIEW OF SYSTEMS:  [X]  denotes positive finding, [ ]  denotes negative finding Cardiac  Comments:  Chest pain or chest pressure:    Shortness of breath upon exertion:    Short of breath when lying flat:    Irregular heart rhythm:        Vascular    Pain in calf, thigh, or hip brought on by ambulation:    Pain in feet at night that wakes you up from your sleep:     Blood clot in your veins:    Leg swelling:         Pulmonary    Oxygen at home:    Productive cough:     Wheezing:         Neurologic    Sudden weakness in arms or legs:  x   Sudden numbness in arms or legs:  x   Sudden onset of difficulty speaking or slurred speech:    Temporary loss of vision in one eye:     Problems with dizziness:         Gastrointestinal    Blood in stool:     Vomited blood:         Genitourinary    Burning when urinating:     Blood in urine:  Psychiatric    Major depression:         Hematologic    Bleeding problems:    Problems with blood clotting too easily:        Skin    Rashes or ulcers:        Constitutional    Fever or chills:      PHYSICAL EXAM: Filed Vitals:   08/15/15 1044  BP: 140/82  Pulse: 90  Temp: 97.3 F (36.3 C)  TempSrc: Oral  Resp: 16  Height: 6' (1.829 m)  Weight: 246 lb (111.585 kg)  SpO2: 95%    GENERAL: The patient is a well-nourished male, in no acute distress. The vital signs are documented above. CARDIAC: There is a regular rate and rhythm.  VASCULAR: 2+ radial 2+ femoral and 2+ dorsalis pedis pulses bilaterally PULMONARY: There is good air exchange bilaterally without wheezing or rales. ABDOMEN: Soft and non-tender with normal pitched bowel sounds. No abdominal incisions MUSCULOSKELETAL: There are no major deformities or cyanosis. NEUROLOGIC: No focal weakness or paresthesias are detected. SKIN: There are no ulcers or rashes noted. PSYCHIATRIC: The patient has a normal affect.    MEDICAL ISSUES: Had long  discussion with patient regarding my role for anterior exposure. Explained mobilization of the peritoneal contents, left ureter and arterial and venous structures overlying the L4-5 and L5-S1 disc. Explain potential for injury to all of these. Do not see any contraindication for anterior exposure. He'll comfortable proceeding with this as planned on 08/23/2015.   Gretta Began [facility] [place]: [phone]

## 2015-08-23 ENCOUNTER — Inpatient Hospital Stay (HOSPITAL_COMMUNITY): Payer: Medicare Other

## 2015-08-23 ENCOUNTER — Encounter (HOSPITAL_COMMUNITY): Payer: Self-pay | Admitting: General Practice

## 2015-08-23 ENCOUNTER — Inpatient Hospital Stay (HOSPITAL_COMMUNITY)
Admission: RE | Admit: 2015-08-23 | Discharge: 2015-08-24 | DRG: 460 | Disposition: A | Discharge status: Home / Self Care | Payer: Medicare Other | Source: Ambulatory Visit | Attending: Neurological Surgery | Admitting: Neurological Surgery

## 2015-08-23 ENCOUNTER — Inpatient Hospital Stay (HOSPITAL_COMMUNITY): Payer: Medicare Other | Admitting: Anesthesiology

## 2015-08-23 ENCOUNTER — Encounter (HOSPITAL_COMMUNITY)
Admission: RE | Disposition: A | Discharge status: Home / Self Care | Payer: Self-pay | Source: Ambulatory Visit | Attending: Neurological Surgery

## 2015-08-23 DIAGNOSIS — M5137 Other intervertebral disc degeneration, lumbosacral region: Secondary | ICD-10-CM | POA: Diagnosis not present

## 2015-08-23 DIAGNOSIS — M4316 Spondylolisthesis, lumbar region: Secondary | ICD-10-CM | POA: Diagnosis present

## 2015-08-23 DIAGNOSIS — M5136 Other intervertebral disc degeneration, lumbar region: Secondary | ICD-10-CM | POA: Diagnosis present

## 2015-08-23 DIAGNOSIS — M961 Postlaminectomy syndrome, not elsewhere classified: Secondary | ICD-10-CM | POA: Diagnosis not present

## 2015-08-23 DIAGNOSIS — Z981 Arthrodesis status: Secondary | ICD-10-CM

## 2015-08-23 DIAGNOSIS — M549 Dorsalgia, unspecified: Secondary | ICD-10-CM | POA: Diagnosis present

## 2015-08-23 DIAGNOSIS — Z452 Encounter for adjustment and management of vascular access device: Secondary | ICD-10-CM

## 2015-08-23 DIAGNOSIS — Z419 Encounter for procedure for purposes other than remedying health state, unspecified: Secondary | ICD-10-CM

## 2015-08-23 DIAGNOSIS — F17210 Nicotine dependence, cigarettes, uncomplicated: Secondary | ICD-10-CM | POA: Diagnosis not present

## 2015-08-23 HISTORY — PX: ABDOMINAL EXPOSURE: SHX5708

## 2015-08-23 HISTORY — PX: ANTERIOR LUMBAR FUSION: SHX1170

## 2015-08-23 LAB — POCT I-STAT 4, (NA,K, GLUC, HGB,HCT)
GLUCOSE: 139 mg/dL — AB (ref 65–99)
HCT: 36 % — ABNORMAL LOW (ref 39.0–52.0)
HEMOGLOBIN: 12.2 g/dL — AB (ref 13.0–17.0)
POTASSIUM: 4.4 mmol/L (ref 3.5–5.1)
Sodium: 141 mmol/L (ref 135–145)

## 2015-08-23 SURGERY — ANTERIOR LUMBAR FUSION 2 LEVELS
Anesthesia: General | Site: Abdomen

## 2015-08-23 MED ORDER — ONDANSETRON HCL 4 MG/2ML IJ SOLN: 4.0000 mg | Freq: Four times a day (QID) | INTRAMUSCULAR | Status: DC | PRN

## 2015-08-23 MED ORDER — SODIUM CHLORIDE 0.9% FLUSH: 3.0000 mL | INTRAVENOUS | Status: DC | PRN

## 2015-08-23 MED ORDER — ONDANSETRON HCL 4 MG/2ML IJ SOLN
INTRAMUSCULAR | Status: AC
  Filled 2015-08-23: qty 2

## 2015-08-23 MED ORDER — OXYCODONE-ACETAMINOPHEN 10-325 MG PO TABS: 1.0000 | ORAL_TABLET | Freq: Four times a day (QID) | ORAL | Status: DC | PRN

## 2015-08-23 MED ORDER — ACETAMINOPHEN 650 MG RE SUPP: 650.0000 mg | RECTAL | Status: DC | PRN

## 2015-08-23 MED ORDER — ROCURONIUM BROMIDE 50 MG/5ML IV SOLN
INTRAVENOUS | Status: AC
  Filled 2015-08-23: qty 1

## 2015-08-23 MED ORDER — LACTATED RINGERS IV SOLN
INTRAVENOUS | Status: DC | PRN
  Administered 2015-08-23 (×3): via INTRAVENOUS

## 2015-08-23 MED ORDER — OXYCODONE HCL 5 MG PO TABS: 5.0000 mg | ORAL_TABLET | Freq: Four times a day (QID) | ORAL | Status: DC | PRN

## 2015-08-23 MED ORDER — SUGAMMADEX SODIUM 500 MG/5ML IV SOLN
INTRAVENOUS | Status: DC | PRN
  Administered 2015-08-23: 250 mg via INTRAVENOUS

## 2015-08-23 MED ORDER — PROPOFOL 10 MG/ML IV BOLUS
INTRAVENOUS | Status: AC
  Filled 2015-08-23: qty 20

## 2015-08-23 MED ORDER — SODIUM CHLORIDE 0.9 % IJ SOLN
INTRAMUSCULAR | Status: AC
  Filled 2015-08-23: qty 10

## 2015-08-23 MED ORDER — SODIUM CHLORIDE 0.9% FLUSH
3.0000 mL | Freq: Two times a day (BID) | INTRAVENOUS | Status: DC
  Administered 2015-08-23: 3 mL via INTRAVENOUS

## 2015-08-23 MED ORDER — GELATIN ABSORBABLE MT POWD
OROMUCOSAL | Status: DC | PRN
  Administered 2015-08-23: 10:00:00 via TOPICAL

## 2015-08-23 MED ORDER — 0.9 % SODIUM CHLORIDE (POUR BTL) OPTIME
TOPICAL | Status: DC | PRN
  Administered 2015-08-23: 1000 mL

## 2015-08-23 MED ORDER — FENTANYL CITRATE (PF) 250 MCG/5ML IJ SOLN
INTRAMUSCULAR | Status: AC
  Filled 2015-08-23: qty 5

## 2015-08-23 MED ORDER — ROCURONIUM BROMIDE 100 MG/10ML IV SOLN
INTRAVENOUS | Status: DC | PRN
  Administered 2015-08-23: 50 mg via INTRAVENOUS
  Administered 2015-08-23: 20 mg via INTRAVENOUS
  Administered 2015-08-23: 30 mg via INTRAVENOUS

## 2015-08-23 MED ORDER — METHOCARBAMOL 1000 MG/10ML IJ SOLN
500.0000 mg | Freq: Four times a day (QID) | INTRAVENOUS | Status: DC | PRN
  Filled 2015-08-23: qty 5

## 2015-08-23 MED ORDER — ALBUMIN HUMAN 5 % IV SOLN
INTRAVENOUS | Status: DC | PRN
  Administered 2015-08-23 (×2): via INTRAVENOUS

## 2015-08-23 MED ORDER — MENTHOL 3 MG MT LOZG
1.0000 | LOZENGE | OROMUCOSAL | Status: DC | PRN
  Filled 2015-08-23: qty 9

## 2015-08-23 MED ORDER — DOCUSATE SODIUM 100 MG PO CAPS
100.0000 mg | ORAL_CAPSULE | Freq: Two times a day (BID) | ORAL | Status: DC
  Administered 2015-08-23: 100 mg via ORAL
  Filled 2015-08-23: qty 1

## 2015-08-23 MED ORDER — HYDROMORPHONE HCL 1 MG/ML IJ SOLN: 0.2500 mg | INTRAMUSCULAR | Status: DC | PRN

## 2015-08-23 MED ORDER — MIDAZOLAM HCL 2 MG/2ML IJ SOLN
INTRAMUSCULAR | Status: AC
  Filled 2015-08-23: qty 2

## 2015-08-23 MED ORDER — FENTANYL CITRATE (PF) 100 MCG/2ML IJ SOLN
INTRAMUSCULAR | Status: DC | PRN
  Administered 2015-08-23 (×3): 50 ug via INTRAVENOUS
  Administered 2015-08-23: 100 ug via INTRAVENOUS
  Administered 2015-08-23 (×5): 50 ug via INTRAVENOUS
  Administered 2015-08-23: 150 ug via INTRAVENOUS

## 2015-08-23 MED ORDER — MORPHINE SULFATE (PF) 4 MG/ML IV SOLN
INTRAVENOUS | Status: AC
  Filled 2015-08-23: qty 1

## 2015-08-23 MED ORDER — PROPOFOL 10 MG/ML IV BOLUS
INTRAVENOUS | Status: DC | PRN
  Administered 2015-08-23: 200 mg via INTRAVENOUS

## 2015-08-23 MED ORDER — OXYCODONE HCL 5 MG PO TABS: 5.0000 mg | ORAL_TABLET | Freq: Once | ORAL | Status: DC | PRN

## 2015-08-23 MED ORDER — THROMBIN 20000 UNITS EX SOLR
CUTANEOUS | Status: DC | PRN
  Administered 2015-08-23: 10:00:00 via TOPICAL

## 2015-08-23 MED ORDER — ONDANSETRON HCL 4 MG/2ML IJ SOLN: 4.0000 mg | INTRAMUSCULAR | Status: DC | PRN

## 2015-08-23 MED ORDER — SUGAMMADEX SODIUM 500 MG/5ML IV SOLN
INTRAVENOUS | Status: AC
  Filled 2015-08-23: qty 5

## 2015-08-23 MED ORDER — VECURONIUM BROMIDE 10 MG IV SOLR
INTRAVENOUS | Status: AC
  Filled 2015-08-23: qty 10

## 2015-08-23 MED ORDER — SODIUM CHLORIDE 0.9 % IR SOLN
Status: DC | PRN
  Administered 2015-08-23: 10:00:00

## 2015-08-23 MED ORDER — DEXAMETHASONE SODIUM PHOSPHATE 10 MG/ML IJ SOLN
INTRAMUSCULAR | Status: AC
  Filled 2015-08-23: qty 1

## 2015-08-23 MED ORDER — CEFAZOLIN SODIUM 1-5 GM-% IV SOLN
1.0000 g | Freq: Three times a day (TID) | INTRAVENOUS | Status: AC
  Administered 2015-08-23 (×2): 1 g via INTRAVENOUS
  Filled 2015-08-23: qty 50

## 2015-08-23 MED ORDER — VECURONIUM BROMIDE 10 MG IV SOLR
INTRAVENOUS | Status: DC | PRN
  Administered 2015-08-23: 2 mg via INTRAVENOUS
  Administered 2015-08-23: 4 mg via INTRAVENOUS
  Administered 2015-08-23 (×2): 2 mg via INTRAVENOUS

## 2015-08-23 MED ORDER — OXYCODONE HCL 5 MG/5ML PO SOLN: 5.0000 mg | Freq: Once | ORAL | Status: DC | PRN

## 2015-08-23 MED ORDER — POTASSIUM CHLORIDE IN NACL 20-0.9 MEQ/L-% IV SOLN
INTRAVENOUS | Status: DC
  Filled 2015-08-23 (×3): qty 1000

## 2015-08-23 MED ORDER — OXYCODONE HCL 5 MG PO TABS
5.0000 mg | ORAL_TABLET | ORAL | Status: DC | PRN
  Administered 2015-08-23 – 2015-08-24 (×4): 10 mg via ORAL
  Filled 2015-08-23 (×4): qty 2

## 2015-08-23 MED ORDER — OXYCODONE-ACETAMINOPHEN 5-325 MG PO TABS
1.0000 | ORAL_TABLET | ORAL | Status: DC | PRN
  Administered 2015-08-23 – 2015-08-24 (×4): 2 via ORAL
  Filled 2015-08-23 (×4): qty 2

## 2015-08-23 MED ORDER — MIDAZOLAM HCL 5 MG/5ML IJ SOLN
INTRAMUSCULAR | Status: DC | PRN
  Administered 2015-08-23: 2 mg via INTRAVENOUS

## 2015-08-23 MED ORDER — ONDANSETRON HCL 4 MG/2ML IJ SOLN
INTRAMUSCULAR | Status: DC | PRN
  Administered 2015-08-23 (×2): 4 mg via INTRAVENOUS

## 2015-08-23 MED ORDER — CHLORHEXIDINE GLUCONATE 4 % EX LIQD: 60.0000 mL | Freq: Once | CUTANEOUS | Status: DC

## 2015-08-23 MED ORDER — CEFAZOLIN SODIUM-DEXTROSE 2-4 GM/100ML-% IV SOLN
2.0000 g | INTRAVENOUS | Status: AC
  Administered 2015-08-23: 2 g via INTRAVENOUS

## 2015-08-23 MED ORDER — PHENOL 1.4 % MT LIQD: 1.0000 | OROMUCOSAL | Status: DC | PRN

## 2015-08-23 MED ORDER — SODIUM CHLORIDE 0.9 % IV SOLN: 250.0000 mL | INTRAVENOUS | Status: DC

## 2015-08-23 MED ORDER — SODIUM CHLORIDE 0.9 % IV SOLN
10.0000 mg | INTRAVENOUS | Status: DC | PRN
  Administered 2015-08-23: 10 ug/min via INTRAVENOUS

## 2015-08-23 MED ORDER — METHOCARBAMOL 500 MG PO TABS
500.0000 mg | ORAL_TABLET | Freq: Four times a day (QID) | ORAL | Status: DC | PRN
  Administered 2015-08-23: 500 mg via ORAL
  Filled 2015-08-23: qty 1

## 2015-08-23 MED ORDER — DEXAMETHASONE SODIUM PHOSPHATE 10 MG/ML IJ SOLN
10.0000 mg | INTRAMUSCULAR | Status: AC
  Administered 2015-08-23: 10 mg via INTRAVENOUS

## 2015-08-23 MED ORDER — CEFAZOLIN SODIUM-DEXTROSE 2-4 GM/100ML-% IV SOLN
INTRAVENOUS | Status: AC
  Filled 2015-08-23: qty 100

## 2015-08-23 MED ORDER — MORPHINE SULFATE (PF) 2 MG/ML IV SOLN
1.0000 mg | INTRAVENOUS | Status: DC | PRN
  Administered 2015-08-23: 4 mg via INTRAVENOUS

## 2015-08-23 MED ORDER — CEFAZOLIN SODIUM 1-5 GM-% IV SOLN
INTRAVENOUS | Status: AC
  Filled 2015-08-23: qty 50

## 2015-08-23 MED ORDER — ACETAMINOPHEN 325 MG PO TABS: 650.0000 mg | ORAL_TABLET | ORAL | Status: DC | PRN

## 2015-08-23 MED ORDER — OXYCODONE-ACETAMINOPHEN 5-325 MG PO TABS
1.0000 | ORAL_TABLET | Freq: Four times a day (QID) | ORAL | Status: DC | PRN
  Administered 2015-08-23: 2 via ORAL
  Filled 2015-08-23: qty 2

## 2015-08-23 SURGICAL SUPPLY — 114 items
APPLIER CLIP 11 MED OPEN (CLIP) ×6
BAG DECANTER FOR FLEXI CONT (MISCELLANEOUS) ×3 IMPLANT
BENZOIN TINCTURE PRP APPL 2/3 (GAUZE/BANDAGES/DRESSINGS) IMPLANT
BONE MATRIX OSTEOCEL PRO MED (Bone Implant) ×3 IMPLANT
BONE MATRIX OSTEOCEL PRO SM (Bone Implant) ×9 IMPLANT
BUR EGG ELITE 5.0 (BURR) ×2 IMPLANT
BUR EGG ELITE 5.0MM (BURR) ×1
BUR MATCHSTICK NEURO 3.0 LAGG (BURR) ×3 IMPLANT
CAGE COROENT XLR 14X38X28-15 (Cage) ×3 IMPLANT
CAGE COROENT XLR 14X38X28-8 (Cage) ×3 IMPLANT
CANISTER SUCT 3000ML PPV (MISCELLANEOUS) ×3 IMPLANT
CLIP APPLIE 11 MED OPEN (CLIP) ×2 IMPLANT
CLOSURE WOUND 1/2 X4 (GAUZE/BANDAGES/DRESSINGS)
COVER BACK TABLE 24X17X13 BIG (DRAPES) IMPLANT
DERMABOND ADVANCED (GAUZE/BANDAGES/DRESSINGS) ×2
DERMABOND ADVANCED .7 DNX12 (GAUZE/BANDAGES/DRESSINGS) ×1 IMPLANT
DRAPE C-ARM 42X72 X-RAY (DRAPES) ×6 IMPLANT
DRAPE INCISE IOBAN 66X45 STRL (DRAPES) IMPLANT
DRAPE LAPAROTOMY 100X72X124 (DRAPES) ×3 IMPLANT
DRAPE POUCH INSTRU U-SHP 10X18 (DRAPES) ×3 IMPLANT
DRSG OPSITE POSTOP 4X6 (GAUZE/BANDAGES/DRESSINGS) ×3 IMPLANT
DURAPREP 26ML APPLICATOR (WOUND CARE) ×3 IMPLANT
ELECT BLADE 4.0 EZ CLEAN MEGAD (MISCELLANEOUS) ×3
ELECT REM PT RETURN 9FT ADLT (ELECTROSURGICAL) ×3
ELECTRODE BLDE 4.0 EZ CLN MEGD (MISCELLANEOUS) ×1 IMPLANT
ELECTRODE REM PT RTRN 9FT ADLT (ELECTROSURGICAL) ×1 IMPLANT
GAUZE SPONGE 4X4 12PLY STRL (GAUZE/BANDAGES/DRESSINGS) IMPLANT
GAUZE SPONGE 4X4 16PLY XRAY LF (GAUZE/BANDAGES/DRESSINGS) IMPLANT
GLOVE BIO SURGEON STRL SZ 6.5 (GLOVE) IMPLANT
GLOVE BIO SURGEON STRL SZ7 (GLOVE) IMPLANT
GLOVE BIO SURGEON STRL SZ8 (GLOVE) ×6 IMPLANT
GLOVE BIO SURGEON STRL SZ8.5 (GLOVE) IMPLANT
GLOVE BIO SURGEONS STRL SZ 6.5 (GLOVE)
GLOVE BIOGEL PI IND STRL 7.0 (GLOVE) ×1 IMPLANT
GLOVE BIOGEL PI IND STRL 7.5 (GLOVE) ×1 IMPLANT
GLOVE BIOGEL PI INDICATOR 7.0 (GLOVE) ×2
GLOVE BIOGEL PI INDICATOR 7.5 (GLOVE) ×2
GLOVE ECLIPSE 6.5 STRL STRAW (GLOVE) IMPLANT
GLOVE ECLIPSE 7.0 STRL STRAW (GLOVE) IMPLANT
GLOVE ECLIPSE 7.5 STRL STRAW (GLOVE) IMPLANT
GLOVE INDICATOR 6.5 STRL GRN (GLOVE) IMPLANT
GLOVE INDICATOR 7.0 STRL GRN (GLOVE) IMPLANT
GLOVE INDICATOR 8.0 STRL GRN (GLOVE) IMPLANT
GLOVE OPTIFIT SS 6.5 STRL BRWN (GLOVE) IMPLANT
GLOVE OPTIFIT SS 7.0 STRL BRWN (GLOVE)
GLOVE OPTIFIT SS 7.5 STRL LX (GLOVE) IMPLANT
GLOVE OPTIFIT SS 8.0 STRL (GLOVE) IMPLANT
GLOVE OPTIFIT SS 8.5 STRL (GLOVE) IMPLANT
GLOVE SS BIOGEL STRL SZ 7.5 (GLOVE) IMPLANT
GLOVE SS N UNI LF 7.5 STRL (GLOVE) ×3 IMPLANT
GLOVE SUPERSENSE BIOGEL SZ 7.5 (GLOVE)
GLOVE SURG SS PI 6.5 STRL IVOR (GLOVE) ×9 IMPLANT
GLOVE SURG SS PI 7.0 STRL IVOR (GLOVE) IMPLANT
GLOVE SURG SS PI 7.5 STRL IVOR (GLOVE) IMPLANT
GLOVE SURG SS PI 8.0 STRL IVOR (GLOVE) IMPLANT
GLOVE SURG SS PI 8.5 STRL IVOR (GLOVE)
GLOVE SURG SS PI 8.5 STRL STRW (GLOVE) IMPLANT
GOWN STRL NON-REIN LRG LVL3 (GOWN DISPOSABLE) IMPLANT
GOWN STRL REUS W/ TWL LRG LVL3 (GOWN DISPOSABLE) ×3 IMPLANT
GOWN STRL REUS W/ TWL XL LVL3 (GOWN DISPOSABLE) ×2 IMPLANT
GOWN STRL REUS W/TWL 2XL LVL3 (GOWN DISPOSABLE) ×3 IMPLANT
GOWN STRL REUS W/TWL LRG LVL3 (GOWN DISPOSABLE) ×6
GOWN STRL REUS W/TWL XL LVL3 (GOWN DISPOSABLE) ×4
HEMOSTAT POWDER KIT SURGIFOAM (HEMOSTASIS) ×3 IMPLANT
INSERT FOGARTY 61MM (MISCELLANEOUS) IMPLANT
INSERT FOGARTY SM (MISCELLANEOUS) IMPLANT
KIT BASIN OR (CUSTOM PROCEDURE TRAY) ×3 IMPLANT
KIT INFUSE X SMALL 1.4CC (Orthopedic Implant) ×3 IMPLANT
KIT ROOM TURNOVER OR (KITS) ×3 IMPLANT
LIQUID BAND (GAUZE/BANDAGES/DRESSINGS) IMPLANT
LOOP VESSEL MAXI BLUE (MISCELLANEOUS) IMPLANT
LOOP VESSEL MINI RED (MISCELLANEOUS) IMPLANT
NEEDLE SPNL 18GX3.5 QUINCKE PK (NEEDLE) ×3 IMPLANT
NS IRRIG 1000ML POUR BTL (IV SOLUTION) ×3 IMPLANT
PACK LAMINECTOMY NEURO (CUSTOM PROCEDURE TRAY) ×3 IMPLANT
PAD ARMBOARD 7.5X6 YLW CONV (MISCELLANEOUS) ×9 IMPLANT
PIN FIXATION BRIGADE 20MM THRD (PIN) ×6 IMPLANT
PLATE BRIGADE LORDOTIC 38MM (Plate) ×6 IMPLANT
PLATE SCREW BRIGADE 5.5X32.5MM (Screw) ×12 IMPLANT
PLATE SCREW BRIGADE 6.5X30.0MM (Screw) ×6 IMPLANT
SCREW PLATE BRIGADE 5.5X30.0MM (Screw) ×6 IMPLANT
SPONGE INTESTINAL PEANUT (DISPOSABLE) IMPLANT
SPONGE LAP 18X18 X RAY DECT (DISPOSABLE) ×3 IMPLANT
SPONGE LAP 4X18 X RAY DECT (DISPOSABLE) IMPLANT
STAPLER VISISTAT 35W (STAPLE) IMPLANT
STRIP CLOSURE SKIN 1/2X4 (GAUZE/BANDAGES/DRESSINGS) IMPLANT
SUT MNCRL AB 4-0 PS2 18 (SUTURE) IMPLANT
SUT PROLENE 0 CT 1 30 (SUTURE) ×6 IMPLANT
SUT PROLENE 4 0 RB 1 (SUTURE)
SUT PROLENE 4-0 RB1 .5 CRCL 36 (SUTURE) IMPLANT
SUT PROLENE 5 0 CC1 (SUTURE) ×9 IMPLANT
SUT PROLENE 6 0 C 1 30 (SUTURE) IMPLANT
SUT PROLENE 6 0 CC (SUTURE) IMPLANT
SUT SILK 0 TIES 10X30 (SUTURE) IMPLANT
SUT SILK 2 0 TIES 10X30 (SUTURE) ×3 IMPLANT
SUT SILK 2 0SH CR/8 30 (SUTURE) IMPLANT
SUT SILK 3 0 TIES 10X30 (SUTURE) IMPLANT
SUT SILK 3 0 TIES 17X18 (SUTURE) ×2
SUT SILK 3 0SH CR/8 30 (SUTURE) IMPLANT
SUT SILK 3-0 18XBRD TIE BLK (SUTURE) ×1 IMPLANT
SUT VIC AB 0 CT1 18XCR BRD8 (SUTURE) IMPLANT
SUT VIC AB 0 CT1 27 (SUTURE) ×4
SUT VIC AB 0 CT1 27XBRD ANBCTR (SUTURE) ×2 IMPLANT
SUT VIC AB 0 CT1 8-18 (SUTURE)
SUT VIC AB 2-0 CP2 18 (SUTURE) ×3 IMPLANT
SUT VIC AB 2-0 CT1 36 (SUTURE) IMPLANT
SUT VIC AB 3-0 SH 27 (SUTURE) ×4
SUT VIC AB 3-0 SH 27X BRD (SUTURE) ×2 IMPLANT
SUT VIC AB 3-0 SH 8-18 (SUTURE) ×6 IMPLANT
SUT VICRYL 4-0 PS2 18IN ABS (SUTURE) IMPLANT
TOWEL OR 17X24 6PK STRL BLUE (TOWEL DISPOSABLE) ×3 IMPLANT
TOWEL OR 17X26 10 PK STRL BLUE (TOWEL DISPOSABLE) ×3 IMPLANT
TRAY FOLEY W/METER SILVER 14FR (SET/KITS/TRAYS/PACK) IMPLANT
WATER STERILE IRR 1000ML POUR (IV SOLUTION) ×3 IMPLANT

## 2015-08-23 NOTE — Op Note (Signed)
    OPERATIVE REPORT  DATE OF SURGERY: 08/23/2015  PATIENT: Manuel Allen, 35 y.o. male MRN: [id number]  DOB: Aug 09, 1980  PRE-OPERATIVE DIAGNOSIS: Degenerative disc disease L4-5 and L5-S1  POST-OPERATIVE DIAGNOSIS:  Same  PROCEDURE: Anterior exposure for L4-5 and L5-S1 surgery  SURGEON:  Gretta Beganodd Tanyla Stege, M.D.  Co-surgeon for the exposure Dr. Marikay Alaravid Jones  ANESTHESIA:  Gen.  EBL: 850 ml  Total I/O In: 2900 [I.V.:2400; IV Piggyback:500] Out: 1625 [Urine:775; Blood:850]  BLOOD ADMINISTERED: None  DRAINS: None  SPECIMEN: None  COUNTS CORRECT:  YES  PLAN OF CARE: None   PATIENT DISPOSITION:  PACU - hemodynamically stable  PROCEDURE DETAILS: Patient was taken to the operating placed supine position where the area the abdomen was prepped and draped in usual sterile fashion. Lateral C-arm view this area revealed the level of C4-5 and 51 disc in relationship to the anterior abdominal wall. An incision was made in the paramedian space to the left of the midline over this area. This was carried down through the subcutaneous fat to the level of the rectus anterior rectus sheath. The fat was mobilized off the anterior rectus sheath and anterior rectus sheath was opened with left cautery in line with the skin incision. The rectus muscle was mobilized circumferentially. The retroperitoneal space was entered laterally and the intraperitoneal contents were mobilized to the right. The left ureter was identified and was mobilized to the right as well. Distally the L5-S1 disc was exposed. There were several middle sacral branches of arteries and veins and these were divided between ligaclips. Blunt dissection gave adequate exposure to the L5-S1 disc. X the arteries and veins were mobilized to the right. This gave exposure of the L4-5 disc. The iliolumbar vein was identified and was ligated with 0 silk ties. The vein was divided. There was some branching and the thigh actually came off of the midportion  of the vein. This was controlled initially with DeBakeys and a 5 5-0 Prolene suture in a figure-of-eight Fashion was used for control. 50 was also used on the distal vein. Continued blunt dissection of the L4-5 disc reveal the level of the lumbar artery and vein at this level. This was clipped with ligaclips and divided. The Thompson retractor was brought onto the field and the reverse lip 150 blades were positioned to the right and left of the L5-S1 disc. The malleable retractors were used for superior and inferior exposure. The discectomy and fusion will be dictated as a separate note by Dr. Yetta BarreJones. After this was completed I re-scrubbed and repositioned the Thompson retractor to the L4-5 level. Again Dr. Yetta BarreJones will dictate the fusion at this level. At the completion the area of the vein repair was again visualized and there was no further bleeding. The retractors were removed after irrigating the space. He fascia was closed with a 0 Prolene suture at the inferior and superior edge of the fascia. This was run to the middle and tied. The skin was closed with a subcutaneous 3-0 Vicryl subcuticular suture and sterile dressing was applied. The patient was transferred to the recovery room in stable condition   Gretta Beganodd Adalynd Donahoe, M.D. 08/23/2015 5:44 PM

## 2015-08-23 NOTE — Transfer of Care (Signed)
Immediate Anesthesia Transfer of Care Note  Patient: Manuel Allen  Procedure(s) Performed: Procedure(s): Lumbar four-five, Lumbar five-Sacral one ANTERIOR LUMBAR FUSION with anterior plating  (N/A) ABDOMINAL EXPOSURE (N/A)  Patient Location: PACU  Anesthesia Type:General  Level of Consciousness: sedated  Airway & Oxygen Therapy: Patient Spontanous Breathing and Patient connected to face mask oxygen  Post-op Assessment: Report given to RN, Post -op Vital signs reviewed and stable and Patient moving all extremities X 4  Post vital signs: Reviewed and stable  Last Vitals:  Filed Vitals:   08/23/15 1300 08/23/15 1302  BP:  132/73  Pulse:  99  Temp: 36.7 C   Resp:  22    Last Pain:  Filed Vitals:   08/23/15 1310  PainSc: 9       Patients Stated Pain Goal: 4 (08/23/15 [id number])  Complications: No apparent anesthesia complications

## 2015-08-23 NOTE — Anesthesia Preprocedure Evaluation (Addendum)
Anesthesia Evaluation  Patient identified by MRN, date of birth, ID band Patient awake    Reviewed: Allergy & Precautions, NPO status , Patient's Chart, lab work & pertinent test results  History of Anesthesia Complications (+) PONV  Airway Mallampati: I   Neck ROM: full    Dental  (+) Teeth Intact   Pulmonary Current Smoker,    breath sounds clear to auscultation       Cardiovascular negative cardio ROS   Rhythm:regular Rate:Normal     Neuro/Psych Anxiety    GI/Hepatic   Endo/Other  obese  Renal/GU      Musculoskeletal  (+) Arthritis ,   Abdominal   Peds  Hematology   Anesthesia Other Findings   Reproductive/Obstetrics                            Anesthesia Physical Anesthesia Plan  ASA: II  Anesthesia Plan: General   Post-op Pain Management:    Induction: Intravenous  Airway Management Planned: Oral ETT  Additional Equipment: CVP  Intra-op Plan:   Post-operative Plan: Extubation in OR  Informed Consent: I have reviewed the patients History and Physical, chart, labs and discussed the procedure including the risks, benefits and alternatives for the proposed anesthesia with the patient or authorized representative who has indicated his/her understanding and acceptance.   Dental advisory given  Plan Discussed with: Anesthesiologist, CRNA and Surgeon  Anesthesia Plan Comments:        Anesthesia Quick Evaluation

## 2015-08-23 NOTE — Anesthesia Procedure Notes (Signed)
Procedure Name: Intubation Date/Time: 08/23/2015 8:44 AM Performed by: Quentin OreWALKER, Manuel Allen Pre-anesthesia Checklist: Patient identified, Emergency Drugs available, Suction available, Patient being monitored and Timeout performed Patient Re-evaluated:Patient Re-evaluated prior to inductionOxygen Delivery Method: Circle system utilized Preoxygenation: Pre-oxygenation with 100% oxygen Intubation Type: IV induction Ventilation: Mask ventilation without difficulty and Oral airway inserted - appropriate to patient size Laryngoscope Size: Mac and 4 Grade View: Grade I Tube type: Oral Tube size: 7.5 mm Number of attempts: 1 Airway Equipment and Method: Stylet Placement Confirmation: ETT inserted through vocal cords under direct vision,  positive ETCO2 and breath sounds checked- equal and bilateral Secured at: 21 cm Tube secured with: Tape Dental Injury: Teeth and Oropharynx as per pre-operative assessment

## 2015-08-23 NOTE — Progress Notes (Signed)
pcxr done in pacu

## 2015-08-23 NOTE — Op Note (Signed)
08/23/2015  12:28 PM  PATIENT:  Manuel Allen  35 y.o. 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 Anterior lumbar interbody fusion L4-L5 and L5-S1 utilizing peek interbody cages packed with morselized allograft, 2. Anterior cervical plating L4-5 and L5-S1 (separate distinct plates were placed at each level) utilizing brigade plates  SURGEON:  Marikay Alaravid Tyan Lasure, MD  Co-surgeon: Dr. Tawanna Coolerodd early  ANESTHESIA:   General  EBL: 850 ml  Total I/O In: 1900 [I.V.:1400; IV Piggyback:500] Out: 1425 [Urine:575; Blood:850]  BLOOD ADMINISTERED:none  DRAINS: None   SPECIMEN:  No Specimen  INDICATION FOR PROCEDURE: This patient underwent a previous decompressive laminectomy with discectomy L4-5 and L5-S1. He presented with back and leg pain. He had an MRI which showed post laminectomy spondylolisthesis L4-5. He had degenerative disc disease at each level. A measurement without relief. His pain interfered with his quality of life. I recommended 2 level anterior lumbar interbody fusion. Patient understood the risks, benefits, and alternatives and potential outcomes and wished to proceed.  PROCEDURE DETAILS: The patient was taken to the operating room and after induction of adequate generalized endotracheal anesthesia he was placed in the supine position on the operating room table. His lumbar region was cleaned and then prepped with DuraPrep and then draped in usual sterile fashion. The exposure and the closure was performed by Dr. Arbie CookeyEarly of vascular surgery and will be dictated in a separate operative report. Once the retractors were in place. I started at the L5-S1 level. The exact same technique was used L4-5 and L5-S1. The anterior annulus was incised with a 15 blade scalpel and then the disc was removed separated from the endplates utilizing a Cobb dissector. I  then removed the disc at each level with a large pituitary rongeur. I then scraped the endplates with curettes to prepare the endplates for arthrodesis. He is a high-speed drill to further prepare the endplates for arthrodesis. I remove disc down to the level of the posterior partial ligament and removed it from the corners. I then used trials to determine what size cage to insert at L4-5 and L5-S1. 14 mm cages were used. An 8 cage was used at L4-5 and a 12 cage was used at L5-S1. These were packed with morcellized allograft and then tapped into position at L4-5 and L5-S1 under lateral fluoroscopy. I then used a 38 mm brigade plates and placed a variable angle screws in the bodies of L4 and L5 and L5 and S1 utilizing lateral fluoroscopy. He is then locked into the plate. Irrigated with saline solution. I checked AP and lateral fluoroscopy to check my final construct. The case was then turned over to Dr. early for closure.  PLAN OF CARE: Admit to inpatient   PATIENT DISPOSITION:  PACU - hemodynamically stable.   Delay start of Pharmacological VTE agent (>24hrs) due to surgical blood loss or risk of bleeding:  yes

## 2015-08-23 NOTE — H&P (Signed)
Subjective: Patient is a 35 y.o. male admitted for ALIF. Onset of symptoms was several months ago, gradually worsening since that time.  The pain is rated intense, unremitting, and is located at the across the lower back and radiates to legs. The pain is described as aching and occurs all day. The symptoms have been progressive. Symptoms are exacerbated by exercise. MRI or CT showed DDD L4-5, L5-S1, previous microdiskectomy.  Past Medical History  Diagnosis Date  . Back pain   . PONV (postoperative nausea and vomiting)   . Family history of adverse reaction to anesthesia     Patients grandmother had a difficult time waking up; patients mother was given to much anesthesia during knee surgery on 09/28/14  . Renal disorder     kidney stones  . Difficulty swallowing     Pt stated "I have difficulty swallowing my spit when I lay down at night"    Past Surgical History  Procedure Laterality Date  . Arm surgery Left     Open treatment internal fixation left forearm radius plate  . Back surgery      Prior to Admission medications   Medication Sig Start Date End Date Taking? Authorizing Provider  oxyCODONE-acetaminophen (PERCOCET) 10-325 MG tablet Take 1-2 tablets by mouth every 6 (six) hours as needed. 07/24/15  Yes Historical Provider, MD   Allergies  Allergen Reactions  . Codeine Hives    Social History  Substance Use Topics  . Smoking status: Current Every Day Smoker -- 1.50 packs/day for 26 years    Types: Cigarettes  . Smokeless tobacco: Current User    Types: Chew  . Alcohol Use: No    Family History  Problem Relation Age of Onset  . Hypertension Mother   . Cancer Other   . Diabetes Other      Review of Systems  Positive ROS: neg  All other systems have been reviewed and were otherwise negative with the exception of those mentioned in the HPI and as above.  Objective: Vital signs in last 24 hours: Temp:  [97.8 F (36.6 C)] 97.8 F (36.6 C) (05/10 0706) Pulse Rate:   [76] 76 (05/10 0706) Resp:  [16] 16 (05/10 0706) BP: (137)/(84) 137/84 mmHg (05/10 0706) SpO2:  [98 %] 98 % (05/10 0706) Weight:  [111.585 kg (246 lb)] 111.585 kg (246 lb) (05/10 0706)  General Appearance: Alert, cooperative, no distress, appears stated age Head: Normocephalic, without obvious abnormality, atraumatic Eyes: PERRL, conjunctiva/corneas clear, EOM's intact    Neck: Supple, symmetrical, trachea midline Back: Symmetric, no curvature, ROM normal, no CVA tenderness Lungs:  respirations unlabored Heart: Regular rate and rhythm Abdomen: Soft, non-tender Extremities: Extremities normal, atraumatic, no cyanosis or edema Pulses: 2+ and symmetric all extremities Skin: Skin color, texture, turgor normal, no rashes or lesions  NEUROLOGIC:   Mental status: Alert and oriented x4,  no aphasia, good attention span, fund of knowledge, and memory Motor Exam - grossly normal Sensory Exam - grossly normal Reflexes: 1+ Coordination - grossly normal Gait - grossly normal Balance - grossly normal Cranial Nerves: I: smell Not tested  II: visual acuity  OS: nl    OD: nl  II: visual fields Full to confrontation  II: pupils Equal, round, reactive to light  III,VII: ptosis None  III,IV,VI: extraocular muscles  Full ROM  V: mastication Normal  V: facial light touch sensation  Normal  V,VII: corneal reflex  Present  VII: facial muscle function - upper  Normal  VII: facial muscle function -  lower Normal  VIII: hearing Not tested  IX: soft palate elevation  Normal  IX,X: gag reflex Present  XI: trapezius strength  5/5  XI: sternocleidomastoid strength 5/5  XI: neck flexion strength  5/5  XII: tongue strength  Normal    Data Review Lab Results  Component Value Date   WBC 8.7 08/14/2015   HGB 14.0 08/14/2015   HCT 41.4 08/14/2015   MCV 88.3 08/14/2015   PLT 227 08/14/2015   Lab Results  Component Value Date   NA 139 08/14/2015   K 3.9 08/14/2015   CL 107 08/14/2015   CO2 23  08/14/2015   BUN 10 08/14/2015   CREATININE 0.96 08/14/2015   GLUCOSE 98 08/14/2015   Lab Results  Component Value Date   INR 1.00 08/14/2015    Assessment/Plan: Patient admitted for ALIF L4-5, L5-S1. Patient has failed a reasonable attempt at conservative therapy.  I explained the condition and procedure to the patient and answered any questions.  Patient wishes to proceed with procedure as planned. Understands risks/ benefits and typical outcomes of procedure.   Zak Gondek S 08/23/2015 7:12 AM

## 2015-08-23 NOTE — Anesthesia Postprocedure Evaluation (Signed)
Anesthesia Post Note  Patient: Manuel Allen  Procedure(s) Performed: Procedure(s) (LRB): Lumbar four-five, Lumbar five-Sacral one ANTERIOR LUMBAR FUSION with anterior plating  (N/A) ABDOMINAL EXPOSURE (N/A)  Patient location during evaluation: PACU Anesthesia Type: General Level of consciousness: awake and alert and patient cooperative Pain management: pain level controlled Vital Signs Assessment: post-procedure vital signs reviewed and stable Respiratory status: spontaneous breathing and respiratory function stable Cardiovascular status: stable Anesthetic complications: no    Last Vitals:  Filed Vitals:   08/23/15 1413 08/23/15 1446  BP:  159/77  Pulse: 94 104  Temp: 36.6 C 36.6 C  Resp: 13 18    Last Pain:  Filed Vitals:   08/23/15 1546  PainSc: Asleep                 Dovid Bartko S

## 2015-08-23 NOTE — Progress Notes (Signed)
Utilization review completed.  

## 2015-08-24 LAB — CBC
HCT: 33.9 % — ABNORMAL LOW (ref 39.0–52.0)
Hemoglobin: 11.2 g/dL — ABNORMAL LOW (ref 13.0–17.0)
MCH: 29.6 pg (ref 26.0–34.0)
MCHC: 33 g/dL (ref 30.0–36.0)
MCV: 89.7 fL (ref 78.0–100.0)
PLATELETS: 183 10*3/uL (ref 150–400)
RBC: 3.78 MIL/uL — AB (ref 4.22–5.81)
RDW: 13.2 % (ref 11.5–15.5)
WBC: 16.3 10*3/uL — AB (ref 4.0–10.5)

## 2015-08-24 MED ORDER — METHOCARBAMOL 750 MG PO TABS: 750.0000 mg | ORAL_TABLET | Freq: Four times a day (QID) | ORAL | Status: DC | PRN

## 2015-08-24 MED ORDER — OXYCODONE-ACETAMINOPHEN 10-325 MG PO TABS: 1.0000 | ORAL_TABLET | ORAL | Status: DC | PRN

## 2015-08-24 NOTE — Progress Notes (Signed)
Subjective: Interval History: none.. Well. Reports moderate abdominal soreness and back discomfort. Has been out of bed.  Objective: Vital signs in last 24 hours: Temp:  [97.9 F (36.6 C)-98.8 F (37.1 C)] 98.5 F (36.9 C) (05/11 0400) Pulse Rate:  [82-118] 82 (05/11 0400) Resp:  [13-22] 20 (05/11 0400) BP: (110-159)/(54-79) 128/64 mmHg (05/11 0400) SpO2:  [94 %-100 %] 95 % (05/11 0400)  Intake/Output from previous day: 05/10 0701 - 05/11 0700 In: 2900 [I.V.:2400; IV Piggyback:500] Out: 2425 [Urine:1575; Blood:850] Intake/Output this shift:    Abdomen soft and nontender. 2+ dorsalis pedis pulses bilaterally.  Lab Results:  Recent Labs  08/23/15 1057 08/24/15 0532  WBC  --  16.3*  HGB 12.2* 11.2*  HCT 36.0* 33.9*  PLT  --  183   BMET  Recent Labs  08/23/15 1057  NA 141  K 4.4  GLUCOSE 139*    Studies/Results: Chest 2 View  08/14/2015  CLINICAL DATA:  Pre-op films for lumbar fusion. No chest complaints. EXAM: CHEST  2 VIEW COMPARISON:  None. FINDINGS: The heart size and mediastinal contours are within normal limits. Both lungs are clear. No pleural effusion or pneumothorax. Bony thorax is intact. IMPRESSION: No active cardiopulmonary disease. Electronically Signed   By: Amie Portland M.D.   On: 08/14/2015 15:28   Dg Lumbar Spine 2-3 Views  08/23/2015  CLINICAL DATA:  L4-S1 fusion EXAM: DG C-ARM 61-120 MIN; LUMBAR SPINE - 2-3 VIEW COMPARISON:  07/17/2015 FINDINGS: Three spot films were obtained intraoperatively. 1 minute and 55 seconds of fluoroscopy was utilized. Interbody fusion is noted at L4-5 and L5-S1 with anterior fixation. No acute hardware abnormality is seen. IMPRESSION: Anterior fusion at L4-5 and L5-S1. Electronically Signed   By: Alcide Clever M.D.   On: 08/23/2015 14:32   Dg Chest Port 1 View  08/23/2015  CLINICAL DATA:  35 year old male status post central line placement. Initial encounter. EXAM: PORTABLE CHEST 1 VIEW COMPARISON:  08/14/2015. FINDINGS:  Portable AP semi upright view at 1329 hours. Right IJ approach venous catheter in place, tip projects about 1 vertebral body above the carina. Lower lung volumes. No pneumothorax. Mediastinal contours remain normal. No acute pulmonary opacity. IMPRESSION: 1. Right IJ central line placed, tip at the upper SVC level. 2. Lower lung volumes. No pneumothorax or acute cardiopulmonary abnormality. Electronically Signed   By: Odessa Fleming M.D.   On: 08/23/2015 13:52   Dg C-arm 61-120 Min  08/23/2015  CLINICAL DATA:  L4-S1 fusion EXAM: DG C-ARM 61-120 MIN; LUMBAR SPINE - 2-3 VIEW COMPARISON:  07/17/2015 FINDINGS: Three spot films were obtained intraoperatively. 1 minute and 55 seconds of fluoroscopy was utilized. Interbody fusion is noted at L4-5 and L5-S1 with anterior fixation. No acute hardware abnormality is seen. IMPRESSION: Anterior fusion at L4-5 and L5-S1. Electronically Signed   By: Alcide Clever M.D.   On: 08/23/2015 14:32   Dg Or Local Abdomen  08/23/2015  CLINICAL DATA:  Evaluate for instruments.  Anterior lumbar fusion. EXAM: OR LOCAL ABDOMEN COMPARISON:  MRI lumbar spine 07/11/2015. FINDINGS: The patient has undergone anterior lumbar interbody fusion at L4-5 and L5-S1. Surgical clips are seen on the LEFT. No sponges are identified. There are no instruments identified. Retroperitoneal air lies along the LEFT hemipelvis. IMPRESSION: Satisfactory intraoperative ALIF radiograph. These results were called by telephone at the time of interpretation on 08/23/2015 at 12:46 pm to circulator nurse, who verbally acknowledged these results. Electronically Signed   By: Elsie Stain M.D.   On: 08/23/2015 12:47  Anti-infectives: Anti-infectives    Start     Dose/Rate Route Frequency Ordered Stop   08/23/15 1320  ceFAZolin (ANCEF) 1-5 GM-% IVPB    Comments:  Hunt, Ronda   : cabinet override      08/23/15 1320 08/24/15 0129   08/23/15 1315  ceFAZolin (ANCEF) IVPB 1 g/50 mL premix     1 g 100 mL/hr over 30 Minutes  Intravenous Every 8 hours 08/23/15 1311 08/23/15 2141   08/23/15 1022  bacitracin 50,000 Units in sodium chloride irrigation 0.9 % 500 mL irrigation  Status:  Discontinued       As needed 08/23/15 1022 08/23/15 1257   08/23/15 0634  ceFAZolin (ANCEF) IVPB 2g/100 mL premix     2 g 200 mL/hr over 30 Minutes Intravenous On call to O.R. 08/23/15 0634 08/23/15 0905   08/23/15 0628  ceFAZolin (ANCEF) 2-4 GM/100ML-% IVPB    Comments:  Edwina BarthHawks, Pamela   : cabinet override      08/23/15 0628 08/23/15 1844      Assessment/Plan: s/p Procedure(s): Lumbar four-five, Lumbar five-Sacral one ANTERIOR LUMBAR FUSION with anterior plating  (N/A) ABDOMINAL EXPOSURE (N/A) Hemoglobin and hematocrit stable. Comfortable. Once to go home. He will discuss this with Dr. Yetta BarreJones. Will not see actively. Please call if we can assist.   LOS: 1 day   Manuel Allen 08/24/2015, 7:07 AM

## 2015-08-24 NOTE — Progress Notes (Signed)
No acute events Pain well controlled Moving legs well Incision looks good D/c today

## 2015-08-24 NOTE — Discharge Instructions (Signed)

## 2015-08-24 NOTE — Progress Notes (Signed)
Pt doing well. Pt and mother given D/C instructions with Rx's, verbal understanding was provided. Pt's incision is clean and dry with no sign of infection. Pt's IV was removed prior to D/C. Pt D/C'd home via wheelchair @ 1155 per MD order. Pt is stable @ D/C and has no other needs at this time. Twylah Bennetts, RN  

## 2015-08-24 NOTE — Discharge Summary (Signed)
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 Anterior lumbar interbody fusion L4-L5 and L5-S1 utilizing peek interbody cages packed with morselized allograft, 2. Anterior cervical plating L4-5 and L5-S1 (separate distinct plates were placed at each level) utilizing brigade plates  Attending: Marikay Alaravid Jones, MD  Hospital Course:  The patient was admitted for the above listed operation.  He tolerated it well.  He had an uneventful post-operative course and is discharged in stable condition.  Discharged Medications: Percocet, robaxin  Follow up: 3 weeks

## 2015-08-24 NOTE — Evaluation (Signed)
Physical Therapy Evaluation and Discharge Patient Details Name: Manuel MatasBrian L Candy MRN: [id number] DOB: 03/19/1981 Today's Date: 08/24/2015   History of Present Illness  Pt is a 35 y/o male who presents s/p L4-S1 anterior lumbar fusion on 08/23/15.  Clinical Impression  Patient evaluated by Physical Therapy with no further acute PT needs identified. All education has been completed and the patient has no further questions. At the time of PT eval pt was able to perform transfers and ambulation with supervision to modified independence. Pt reports that he feels comfortable returning home and was educated on general safety, walking program, and car transfer. See below for any follow-up Physical Therapy or equipment needs. PT is signing off. Thank you for this referral.      Follow Up Recommendations Outpatient PT    Equipment Recommendations  None recommended by PT    Recommendations for Other Services       Precautions / Restrictions Precautions Precautions: Fall;Back Precaution Booklet Issued: Yes (comment) Precaution Comments: Reviewed handout and pt was cued for precautions during functional mobility.  Restrictions Weight Bearing Restrictions: No      Mobility  Bed Mobility Overal bed mobility: Needs Assistance Bed Mobility: Rolling;Sidelying to Sit Rolling: Modified independent (Device/Increase time) Sidelying to sit: Supervision       General bed mobility comments: VC's for proper log roll. HOB flat and rails lowered to simulate home environment.   Transfers Overall transfer level: Needs assistance Equipment used: None Transfers: Sit to/from Stand Sit to Stand: Supervision         General transfer comment: VC's for hand placement on seated surface for safety. Pt wanting to hold to the IV pole to stand but was able to complete without assist.   Ambulation/Gait Ambulation/Gait assistance: Modified independent (Device/Increase time) Ambulation Distance (Feet): 400  Feet Assistive device: None Gait Pattern/deviations: Step-through pattern;Decreased stride length;Trunk flexed Gait velocity: Decreased Gait velocity interpretation: Below normal speed for age/gender General Gait Details: No unteadiness or LOB noted. Generally slow and guarded but overall safe.   Stairs            Wheelchair Mobility    Modified Rankin (Stroke Patients Only)       Balance Overall balance assessment: No apparent balance deficits (not formally assessed)                                           Pertinent Vitals/Pain Pain Assessment: Faces Faces Pain Scale: Hurts a little bit Pain Location: Incision, abdomen Pain Descriptors / Indicators: Operative site guarding;Sore Pain Intervention(s): Limited activity within patient's tolerance;Monitored during session;Repositioned    Home Living Family/patient expects to be discharged to:: Private residence Living Arrangements: Spouse/significant other;Parent Available Help at Discharge: Family;Available 24 hours/day Type of Home: House Home Access: Level entry     Home Layout: One level Home Equipment: Cane - single point;Shower seat      Prior Function Level of Independence: Independent               Hand Dominance   Dominant Hand: Right    Extremity/Trunk Assessment   Upper Extremity Assessment: Defer to OT evaluation           Lower Extremity Assessment: Overall WFL for tasks assessed      Cervical / Trunk Assessment: Normal (Surgical incision present)  Communication   Communication: No difficulties  Cognition Arousal/Alertness: Awake/alert Behavior During Therapy:  WFL for tasks assessed/performed Overall Cognitive Status: Within Functional Limits for tasks assessed                      General Comments      Exercises        Assessment/Plan    PT Assessment Patent does not need any further PT services  PT Diagnosis Difficulty walking;Acute pain    PT Problem List    PT Treatment Interventions     PT Goals (Current goals can be found in the Care Plan section) Acute Rehab PT Goals Patient Stated Goal: Home today PT Goal Formulation: All assessment and education complete, DC therapy    Frequency     Barriers to discharge        Co-evaluation               End of Session Equipment Utilized During Treatment: Gait belt Activity Tolerance: Patient tolerated treatment well Patient left: in chair;with call bell/phone within reach Nurse Communication: Mobility status         Time: 1610-9604 PT Time Calculation (min) (ACUTE ONLY): 16 min   Charges:   PT Evaluation $PT Eval Moderate Complexity: 1 Procedure     PT G Codes:        Conni Slipper Sep 01, 2015, 9:07 AM   Conni Slipper, PT, DPT Acute Rehabilitation Services Pager: [phone]

## 2015-08-24 NOTE — Progress Notes (Signed)
Occupational Therapy Evaluation/Discharge Patient Details Name: Manuel MatasBrian L Allen MRN: [id number] DOB: June 09, 1980 Today's Date: 08/24/2015    History of Present Illness Pt is a 35 y/o male who presents s/p L4-S1 anterior lumbar fusion on 08/23/15.   Clinical Impression   Patient has been evaluated by Occupational Therapy with no further acute OT needs identified. Pt completed all transfers and ADLs at supervision level with cues for compensatory strategies. Educated pt on back precautions during functional tasks, compensatory strategies for LB ADLs, energy conservation, activity restrictions and progress, and fall prevention strategies. No OT follow up or DME recommendations at this time. All education has been completed and pt has no further questions. OT signing off. Thank you for this referral.    Follow Up Recommendations  No OT follow up;Supervision/Assistance - 24 hour    Equipment Recommendations  None recommended by OT    Recommendations for Other Services       Precautions / Restrictions Precautions Precautions: Fall;Back Precaution Booklet Issued: No Precaution Comments: Pt able to recall 1/3 back precautions at beginning of session. Reviewed precautions during functional tasks and went over handout Restrictions Weight Bearing Restrictions: No      Mobility Bed Mobility Overal bed mobility: Needs Assistance Bed Mobility: Rolling;Sidelying to Sit Rolling: Modified independent (Device/Increase time) Sidelying to sit: Supervision       General bed mobility comments: Pt up in chair on OT arrival. Verbally reviewed log roll technique.  Transfers Overall transfer level: Needs assistance Equipment used: None Transfers: Sit to/from Stand Sit to Stand: Supervision         General transfer comment: Supervision for safety. Good demonstration of safe hand placement on seated surfaces.    Balance Overall balance assessment: Needs assistance Sitting-balance support: No  upper extremity supported;Feet supported Sitting balance-Leahy Scale: Good Sitting balance - Comments: limited by pain   Standing balance support: No upper extremity supported;During functional activity Standing balance-Leahy Scale: Good Standing balance comment: Llimitations noted, pt attempting to hold onto wall and bed while ambulating in room                            ADL Overall ADL's : Needs assistance/impaired     Grooming: Wash/dry hands;Supervision/safety;Standing;Cueing for compensatory techniques Grooming Details (indicate cue type and reason): Educated on 2 cup method for oral care Upper Body Bathing: Supervision/ safety;Sitting   Lower Body Bathing: Supervison/ safety;Cueing for compensatory techniques;Sit to/from stand Lower Body Bathing Details (indicate cue type and reason): able to cross ankle-over-knee Upper Body Dressing : Supervision/safety;Sitting   Lower Body Dressing: Supervision/safety;Sit to/from stand;Cueing for back precautions;Cueing for compensatory techniques Lower Body Dressing Details (indicate cue type and reason): able to cross ankle-over-knee Toilet Transfer: Supervision/safety;Cueing for safety;Ambulation;BSC Toilet Transfer Details (indicate cue type and reason): BSC over toilet, cues to feel BSC on back of legs before reaching back to sit to avoid twisting Toileting- Clothing Manipulation and Hygiene: Supervision/safety;Sit to/from stand;Cueing for compensatory techniques Toileting - Clothing Manipulation Details (indicate cue type and reason): educated on use of wet wipes Tub/ Shower Transfer: Tub transfer;Supervision/safety;Cueing for safety;Ambulation Tub/Shower Transfer Details (indicate cue type and reason): Educated on placement of 3in1 in tub to use as shower seat. Adivsed pt to have supervision from family member. Functional mobility during ADLs: Supervision/safety General ADL Comments: Educated on back precautions, compensatory  strategies for ADLs, proper use of DME, gradually increasing activity level, and fall prevention strateiges. No family present for OT eval.  Vision Vision Assessment?: No apparent visual deficits   Perception     Praxis      Pertinent Vitals/Pain Pain Assessment: 0-10 Pain Score: 10-Worst pain ever Faces Pain Scale: Hurts a little bit Pain Location: incision site Pain Descriptors / Indicators: Sore Pain Intervention(s): Limited activity within patient's tolerance;Monitored during session;Repositioned;Ice applied;Patient requesting pain meds-RN notified     Hand Dominance Right   Extremity/Trunk Assessment Upper Extremity Assessment Upper Extremity Assessment: Overall WFL for tasks assessed   Lower Extremity Assessment Lower Extremity Assessment: Overall WFL for tasks assessed   Cervical / Trunk Assessment Cervical / Trunk Assessment: Normal   Communication Communication Communication: No difficulties   Cognition Arousal/Alertness: Awake/alert Behavior During Therapy: WFL for tasks assessed/performed Overall Cognitive Status: Within Functional Limits for tasks assessed                     General Comments       Exercises       Shoulder Instructions      Home Living Family/patient expects to be discharged to:: Private residence Living Arrangements: Spouse/significant other;Parent Available Help at Discharge: Family;Available 24 hours/day Type of Home: House Home Access: Level entry     Home Layout: One level     Bathroom Shower/Tub: Tub/shower unit Shower/tub characteristics: Engineer, building services: Standard Bathroom Accessibility: Yes How Accessible: Accessible via walker Home Equipment: Cane - single point;Bedside commode          Prior Functioning/Environment Level of Independence: Independent        Comments: Works in [employer], enjoys duck and deer hunting    OT Diagnosis: Acute pain   OT Problem List: Decreased  strength;Decreased range of motion;Decreased activity tolerance;Impaired balance (sitting and/or standing);Decreased safety awareness;Decreased knowledge of use of DME or AE;Decreased knowledge of precautions;Pain   OT Treatment/Interventions:      OT Goals(Current goals can be found in the care plan section) Acute Rehab OT Goals Patient Stated Goal: Home today OT Goal Formulation: With patient Time For Goal Achievement: 09/07/15 Potential to Achieve Goals: Good  OT Frequency:     Barriers to D/C:            Co-evaluation              End of Session Equipment Utilized During Treatment: Gait belt;Rolling walker Nurse Communication: Mobility status;Patient requests pain meds  Activity Tolerance: Patient limited by pain Patient left: in chair;with call bell/phone within reach   Time: 0831-0856 OT Time Calculation (min): 25 min Charges:  OT General Charges $OT Visit: 1 Procedure OT Evaluation $OT Eval Moderate Complexity: 1 Procedure OT Treatments $Self Care/Home Management : 8-22 mins G-Codes:    Nils Pyle, OTR/L Pager: [phone] 08/24/2015, 9:27 AM

## 2015-08-25 ENCOUNTER — Encounter (HOSPITAL_COMMUNITY): Payer: Self-pay | Admitting: Neurological Surgery

## 2015-08-29 ENCOUNTER — Encounter (HOSPITAL_COMMUNITY): Payer: Self-pay | Admitting: Neurological Surgery

## 2015-08-30 ENCOUNTER — Encounter (HOSPITAL_COMMUNITY): Payer: Self-pay | Admitting: Neurological Surgery

## 2015-10-04 ENCOUNTER — Other Ambulatory Visit (HOSPITAL_COMMUNITY): Payer: Self-pay | Admitting: Neurological Surgery

## 2015-10-04 DIAGNOSIS — M545 Low back pain: Secondary | ICD-10-CM

## 2015-10-05 ENCOUNTER — Encounter (HOSPITAL_COMMUNITY): Payer: Self-pay | Admitting: Neurological Surgery

## 2015-10-05 ENCOUNTER — Inpatient Hospital Stay (HOSPITAL_COMMUNITY)
Admission: AD | Admit: 2015-10-05 | Discharge: 2015-10-07 | DRG: 857 | Disposition: A | Discharge status: Home Health | Payer: Medicare Other | Source: Ambulatory Visit | Attending: Neurological Surgery | Admitting: Neurological Surgery

## 2015-10-05 ENCOUNTER — Inpatient Hospital Stay (HOSPITAL_COMMUNITY): Payer: Medicare Other

## 2015-10-05 DIAGNOSIS — Z981 Arthrodesis status: Secondary | ICD-10-CM

## 2015-10-05 DIAGNOSIS — L02211 Cutaneous abscess of abdominal wall: Secondary | ICD-10-CM | POA: Diagnosis present

## 2015-10-05 DIAGNOSIS — T814XXA Infection following a procedure, initial encounter: Principal | ICD-10-CM | POA: Diagnosis present

## 2015-10-05 DIAGNOSIS — F17210 Nicotine dependence, cigarettes, uncomplicated: Secondary | ICD-10-CM | POA: Diagnosis present

## 2015-10-05 DIAGNOSIS — R188 Other ascites: Secondary | ICD-10-CM | POA: Diagnosis not present

## 2015-10-05 DIAGNOSIS — IMO0002 Reserved for concepts with insufficient information to code with codable children: Secondary | ICD-10-CM

## 2015-10-05 DIAGNOSIS — L03311 Cellulitis of abdominal wall: Secondary | ICD-10-CM | POA: Diagnosis present

## 2015-10-05 DIAGNOSIS — Z79899 Other long term (current) drug therapy: Secondary | ICD-10-CM

## 2015-10-05 DIAGNOSIS — Y838 Other surgical procedures as the cause of abnormal reaction of the patient, or of later complication, without mention of misadventure at the time of the procedure: Secondary | ICD-10-CM | POA: Diagnosis present

## 2015-10-05 DIAGNOSIS — IMO0001 Reserved for inherently not codable concepts without codable children: Secondary | ICD-10-CM

## 2015-10-05 DIAGNOSIS — R509 Fever, unspecified: Secondary | ICD-10-CM | POA: Diagnosis present

## 2015-10-05 DIAGNOSIS — T8149XA Infection following a procedure, other surgical site, initial encounter: Secondary | ICD-10-CM | POA: Diagnosis present

## 2015-10-05 MED ORDER — MORPHINE SULFATE (PF) 2 MG/ML IV SOLN
1.0000 mg | INTRAVENOUS | Status: DC | PRN
  Administered 2015-10-06: 2 mg via INTRAVENOUS
  Filled 2015-10-05: qty 1

## 2015-10-05 MED ORDER — OXYCODONE HCL 5 MG PO TABS
5.0000 mg | ORAL_TABLET | ORAL | Status: DC | PRN
  Administered 2015-10-05: 5 mg via ORAL
  Administered 2015-10-06: 10 mg via ORAL
  Filled 2015-10-05: qty 2
  Filled 2015-10-05 (×2): qty 1

## 2015-10-05 MED ORDER — ONDANSETRON HCL 4 MG/2ML IJ SOLN: 4.0000 mg | INTRAMUSCULAR | Status: DC | PRN

## 2015-10-05 MED ORDER — POTASSIUM CHLORIDE IN NACL 20-0.9 MEQ/L-% IV SOLN
INTRAVENOUS | Status: DC
  Administered 2015-10-05: 17:00:00 via INTRAVENOUS
  Filled 2015-10-05 (×2): qty 1000

## 2015-10-05 MED ORDER — DOCUSATE SODIUM 100 MG PO CAPS
100.0000 mg | ORAL_CAPSULE | Freq: Two times a day (BID) | ORAL | Status: DC
  Administered 2015-10-07: 100 mg via ORAL
  Filled 2015-10-05 (×4): qty 1

## 2015-10-05 MED ORDER — METHOCARBAMOL 750 MG PO TABS
750.0000 mg | ORAL_TABLET | Freq: Four times a day (QID) | ORAL | Status: DC | PRN
  Administered 2015-10-07: 750 mg via ORAL
  Filled 2015-10-05 (×3): qty 1

## 2015-10-05 MED ORDER — OXYCODONE-ACETAMINOPHEN 5-325 MG PO TABS
1.0000 | ORAL_TABLET | ORAL | Status: DC | PRN
  Administered 2015-10-05 – 2015-10-07 (×9): 2 via ORAL
  Filled 2015-10-05 (×5): qty 2

## 2015-10-05 MED ORDER — VANCOMYCIN HCL IN DEXTROSE 1-5 GM/200ML-% IV SOLN
1000.0000 mg | Freq: Three times a day (TID) | INTRAVENOUS | Status: DC
  Administered 2015-10-06 (×5): 1000 mg via INTRAVENOUS
  Filled 2015-10-05 (×6): qty 200

## 2015-10-05 MED ORDER — ACETAMINOPHEN 650 MG RE SUPP: 650.0000 mg | RECTAL | Status: DC | PRN

## 2015-10-05 MED ORDER — DEXTROSE 5 % IV SOLN
2.0000 g | INTRAVENOUS | Status: DC
  Administered 2015-10-05 – 2015-10-06 (×2): 2 g via INTRAVENOUS
  Filled 2015-10-05 (×3): qty 2

## 2015-10-05 MED ORDER — DIATRIZOATE MEGLUMINE & SODIUM 66-10 % PO SOLN
ORAL | Status: AC
  Filled 2015-10-05: qty 30

## 2015-10-05 MED ORDER — ACETAMINOPHEN 325 MG PO TABS: 650.0000 mg | ORAL_TABLET | ORAL | Status: DC | PRN

## 2015-10-05 MED ORDER — MENTHOL 3 MG MT LOZG: 1.0000 | LOZENGE | OROMUCOSAL | Status: DC | PRN

## 2015-10-05 MED ORDER — OXYCODONE-ACETAMINOPHEN 10-325 MG PO TABS: 1.0000 | ORAL_TABLET | ORAL | Status: DC | PRN

## 2015-10-05 MED ORDER — VANCOMYCIN HCL 10 G IV SOLR
2000.0000 mg | Freq: Once | INTRAVENOUS | Status: AC
  Administered 2015-10-05: 2000 mg via INTRAVENOUS
  Filled 2015-10-05: qty 2000

## 2015-10-05 MED ORDER — PHENOL 1.4 % MT LIQD: 1.0000 | OROMUCOSAL | Status: DC | PRN

## 2015-10-05 NOTE — H&P (Signed)
Subjective: Patient is a 35 y.o. male who complains of mild fever and wound pain. S/P 2 level ALIF 1 months ago. Seen in office just 2 days ago. Onset of symptoms was several days ago, gradually worsening since that time.  Onset was not related to no known injury. The pain is rated moderate, severe, and is located at the across the lower back. The pain is described as aching and occurs all day. Symptoms are exacerbated by exercise. Called office today with complaints of redness of wound and mild fevers and I asked he be admitted for evaluation. Dr Early aware and recs CT abd/pelvis. ESR 2 and CRP 11.2.  Past Medical History  Diagnosis Date  . Back pain   . PONV (postoperative nausea and vomiting)   . Family history of adverse reaction to anesthesia     Patients grandmother had a difficult time waking up; patients mother was given to much anesthesia during knee surgery on 09/28/14  . Renal disorder     kidney stones  . Difficulty swallowing     Pt stated "I have difficulty swallowing my spit when I lay down at night"    Past Surgical History  Procedure Laterality Date  . Arm surgery Left     Open treatment internal fixation left forearm radius plate  . Back surgery    . Anterior lumbar fusion N/A 08/23/2015    Procedure: Lumbar four-five, Lumbar five-Sacral one ANTERIOR LUMBAR FUSION with anterior plating ;  Surgeon: Eustace Moore, MD;  Location: [facility];  Service: Neurosurgery;  Laterality: N/A;  . Abdominal exposure N/A 08/23/2015    Procedure: ABDOMINAL EXPOSURE;  Surgeon: Rosetta Posner, MD;  Location: [facility];  Service: Vascular;  Laterality: N/A;    Allergies  Allergen Reactions  . Codeine Hives    Social History  Substance Use Topics  . Smoking status: Current Every Day Smoker -- 1.50 packs/day for 26 years    Types: Cigarettes  . Smokeless tobacco: Current User    Types: Chew  . Alcohol Use: No    Family History  Problem Relation Age of Onset  . Hypertension Mother    . Cancer Other   . Diabetes Other    Prior to Admission medications   Medication Sig Start Date End Date Taking? Authorizing Provider  methocarbamol (ROBAXIN) 750 MG tablet Take 1 tablet (750 mg total) by mouth every 6 (six) hours as needed for muscle spasms. 08/24/15   Kevan Ny Ditty, MD  oxyCODONE-acetaminophen (PERCOCET) 10-325 MG tablet Take 1-2 tablets by mouth every 4 (four) hours as needed. 08/24/15   Kevan Ny Ditty, MD     Review of Systems  Positive ROS: neg  All other systems have been reviewed and were otherwise negative with the exception of those mentioned in the HPI and as above.  Objective: Vital signs in last 24 hours: Temp:  [99.1 F (37.3 C)] 99.1 F (37.3 C) (06/22 1150) Pulse Rate:  [94] 94 (06/22 1150) Resp:  [16] 16 (06/22 1150) BP: (149)/(80) 149/80 mmHg (06/22 1150) SpO2:  [98 %] 98 % (06/22 1150)  General Appearance: Alert, cooperative, no distress, appears stated age Head: Normocephalic, without obvious abnormality, atraumatic Eyes: PERRL, conjunctiva/corneas clear, EOM's intact, fundi benign, both eyes      Ears: Normal TM's and external ear canals, both ears Throat: Lips, mucosa, and tongue normal; teeth and gums normal Neck: Supple, symmetrical, trachea midline, no adenopathy; thyroid: No enlargement/tenderness/nodules; no carotid bruit or JVD Back: Symmetric,  no curvature, ROM normal, no CVA tenderness Lungs: Clear to auscultation bilaterally, respirations unlabored Heart: Regular rate and rhythm, S1 and S2 normal, no murmur, rub or gallop Abdomen: Soft, non-tender, bowel sounds active all four quadrants, no masses, no organomegaly Extremities: Extremities normal, atraumatic, no cyanosis or edema Pulses: 2+ and symmetric all extremities Skin: wound indurated with cellulitis and somewhat tender, small 3 mm area in center with serosanguinous drainage   NEUROLOGIC:   Mental status: alert and oriented, no aphasia, good attention span,  Fund of knowledge/ memory ok Motor Exam - grossly normal Sensory Exam - grossly normal Reflexes: 1+ Coordination - grossly normal Gait - grossly normal Balance - grossly normal Cranial Nerves: I: smell Not tested  II: visual acuity  OS: na    OD: na  II: visual fields Full to confrontation  II: pupils Equal, round, reactive to light  III,VII: ptosis None  III,IV,VI: extraocular muscles  Full ROM  V: mastication Normal  V: facial light touch sensation  Normal  V,VII: corneal reflex  Present  VII: facial muscle function - upper  Normal  VII: facial muscle function - lower Normal  VIII: hearing Not tested  IX: soft palate elevation  Normal  IX,X: gag reflex Present  XI: trapezius strength  5/5  XI: sternocleidomastoid strength 5/5  XI: neck flexion strength  5/5  XII: tongue strength  Normal    Data Review Lab Results  Component Value Date   WBC 16.3* 08/24/2015   HGB 11.2* 08/24/2015   HCT 33.9* 08/24/2015   MCV 89.7 08/24/2015   PLT 183 08/24/2015   Lab Results  Component Value Date   NA 141 08/23/2015   K 4.4 08/23/2015   CL 107 08/14/2015   CO2 23 08/14/2015   BUN 10 08/14/2015   CREATININE 0.96 08/14/2015   GLUCOSE 139* 08/23/2015   Lab Results  Component Value Date   INR 1.00 08/14/2015    Assessment/Plan: S/p ALIF with at least superficial wound infection. CT abd/ pelvis to rule out deeper infxn or loculation. Hold Abx until after CT. ESR and CRP more c/w superficial infection. May need MRI to further evaluate spine given subsidence seen on plain films this week, outpt CT was ordered to evaluate this but today's CT will replace that.   Zariya Minner S 10/05/2015 12:21 PM

## 2015-10-05 NOTE — Progress Notes (Signed)
Pharmacy Antibiotic Note  Dorthula MatasBrian L Spadafora is a 35 y.o. male admitted on 10/05/2015 with wound infection.  Pharmacy has been consulted for Vancomycin and Rocephin dosing.  S/p ALIF 1 month ago, now with superficial wound  Scr stable at 0.96, no antibiotics yet to be given  Plan: Rocephin 2 grams iv Q 24 hours Vancomycin 2 grams iv x 1 dose now, then 1 gram iv Q 8 hours Follow up antibiotic plan     Temp (24hrs), Avg:99.1 F (37.3 C), Min:99.1 F (37.3 C), Max:99.1 F (37.3 C)  No results for input(s): WBC, CREATININE, LATICACIDVEN, VANCOTROUGH, VANCOPEAK, VANCORANDOM, GENTTROUGH, GENTPEAK, GENTRANDOM, TOBRATROUGH, TOBRAPEAK, TOBRARND, AMIKACINPEAK, AMIKACINTROU, AMIKACIN in the last 168 hours.  CrCl cannot be calculated (Unknown ideal weight.).    Allergies  Allergen Reactions  . Codeine Hives     Thank you for allowing pharmacy to be a part of this patient's care. Okey RegalLisa Byrd Terrero, PharmD [phone] 10/05/2015 3:47 PM

## 2015-10-05 NOTE — Progress Notes (Signed)
Patient arrived to [phone] as direct admit at current time. Patient alert and oriented X4 with no c/o pain. Vital signs taken and charted. Oriented to room with bed alarm on . Diet ordered. Family at bedside.

## 2015-10-06 ENCOUNTER — Inpatient Hospital Stay (HOSPITAL_COMMUNITY): Payer: Medicare Other | Admitting: Anesthesiology

## 2015-10-06 ENCOUNTER — Encounter (HOSPITAL_COMMUNITY)
Admission: AD | Disposition: A | Discharge status: Home Health | Payer: Self-pay | Source: Ambulatory Visit | Attending: Neurological Surgery

## 2015-10-06 DIAGNOSIS — R188 Other ascites: Secondary | ICD-10-CM

## 2015-10-06 HISTORY — PX: WOUND EXPLORATION: SHX6188

## 2015-10-06 SURGERY — WOUND EXPLORATION
Anesthesia: General | Site: Abdomen | Laterality: Left

## 2015-10-06 MED ORDER — PROPOFOL 10 MG/ML IV BOLUS
INTRAVENOUS | Status: DC | PRN
  Administered 2015-10-06: 200 mg via INTRAVENOUS

## 2015-10-06 MED ORDER — SODIUM CHLORIDE 0.9 % IR SOLN
Status: DC | PRN
  Administered 2015-10-06: 500 mL

## 2015-10-06 MED ORDER — ROCURONIUM BROMIDE 100 MG/10ML IV SOLN
INTRAVENOUS | Status: DC | PRN
  Administered 2015-10-06: 40 mg via INTRAVENOUS

## 2015-10-06 MED ORDER — 0.9 % SODIUM CHLORIDE (POUR BTL) OPTIME
TOPICAL | Status: DC | PRN
  Administered 2015-10-06: 1000 mL

## 2015-10-06 MED ORDER — FENTANYL CITRATE (PF) 100 MCG/2ML IJ SOLN
INTRAMUSCULAR | Status: DC | PRN
  Administered 2015-10-06: 150 ug via INTRAVENOUS
  Administered 2015-10-06: 100 ug via INTRAVENOUS

## 2015-10-06 MED ORDER — OXYCODONE-ACETAMINOPHEN 5-325 MG PO TABS
1.0000 | ORAL_TABLET | ORAL | Status: DC | PRN
  Filled 2015-10-06 (×4): qty 2

## 2015-10-06 MED ORDER — MIDAZOLAM HCL 2 MG/2ML IJ SOLN
INTRAMUSCULAR | Status: AC
  Filled 2015-10-06: qty 2

## 2015-10-06 MED ORDER — MIDAZOLAM HCL 5 MG/5ML IJ SOLN
INTRAMUSCULAR | Status: DC | PRN
  Administered 2015-10-06: 2 mg via INTRAVENOUS

## 2015-10-06 MED ORDER — LACTATED RINGERS IV SOLN
INTRAVENOUS | Status: DC
  Administered 2015-10-06: 10:00:00 via INTRAVENOUS

## 2015-10-06 MED ORDER — FENTANYL CITRATE (PF) 250 MCG/5ML IJ SOLN
INTRAMUSCULAR | Status: AC
  Filled 2015-10-06: qty 5

## 2015-10-06 MED ORDER — LIDOCAINE HCL (CARDIAC) 20 MG/ML IV SOLN
INTRAVENOUS | Status: DC | PRN
  Administered 2015-10-06: 40 mg via INTRAVENOUS
  Administered 2015-10-06: 60 mg via INTRAVENOUS

## 2015-10-06 MED ORDER — ONDANSETRON HCL 4 MG/2ML IJ SOLN
INTRAMUSCULAR | Status: DC | PRN
  Administered 2015-10-06: 4 mg via INTRAVENOUS

## 2015-10-06 MED ORDER — SUGAMMADEX SODIUM 200 MG/2ML IV SOLN
INTRAVENOUS | Status: AC
  Filled 2015-10-06: qty 2

## 2015-10-06 MED ORDER — SUGAMMADEX SODIUM 200 MG/2ML IV SOLN
INTRAVENOUS | Status: DC | PRN
  Administered 2015-10-06: 400 mg via INTRAVENOUS

## 2015-10-06 SURGICAL SUPPLY — 32 items
BNDG GAUZE ELAST 4 BULKY (GAUZE/BANDAGES/DRESSINGS) ×3 IMPLANT
CANISTER SUCTION 2500CC (MISCELLANEOUS) ×3 IMPLANT
COVER SURGICAL LIGHT HANDLE (MISCELLANEOUS) ×3 IMPLANT
DRAPE INCISE IOBAN 66X45 STRL (DRAPES) ×3 IMPLANT
DRAPE PROXIMA HALF (DRAPES) IMPLANT
DRSG PAD ABDOMINAL 8X10 ST (GAUZE/BANDAGES/DRESSINGS) IMPLANT
ELECT REM PT RETURN 9FT ADLT (ELECTROSURGICAL) ×3
ELECTRODE REM PT RTRN 9FT ADLT (ELECTROSURGICAL) ×1 IMPLANT
GAUZE PACKING IODOFORM 1 (PACKING) IMPLANT
GLOVE BIO SURGEON STRL SZ 6 (GLOVE) ×3 IMPLANT
GLOVE BIO SURGEON STRL SZ7.5 (GLOVE) ×3 IMPLANT
GLOVE BIOGEL PI IND STRL 6.5 (GLOVE) ×1 IMPLANT
GLOVE BIOGEL PI IND STRL 7.0 (GLOVE) ×1 IMPLANT
GLOVE BIOGEL PI IND STRL 8 (GLOVE) ×1 IMPLANT
GLOVE BIOGEL PI INDICATOR 6.5 (GLOVE) ×2
GLOVE BIOGEL PI INDICATOR 7.0 (GLOVE) ×2
GLOVE BIOGEL PI INDICATOR 8 (GLOVE) ×2
GLOVE ECLIPSE 6.5 STRL STRAW (GLOVE) ×3 IMPLANT
GOWN STRL REUS W/ TWL LRG LVL3 (GOWN DISPOSABLE) ×3 IMPLANT
GOWN STRL REUS W/TWL LRG LVL3 (GOWN DISPOSABLE) ×6
KIT BASIN OR (CUSTOM PROCEDURE TRAY) ×3 IMPLANT
KIT ROOM TURNOVER OR (KITS) ×3 IMPLANT
NS IRRIG 1000ML POUR BTL (IV SOLUTION) ×6 IMPLANT
PACK GENERAL/GYN (CUSTOM PROCEDURE TRAY) ×3 IMPLANT
PACK UNIVERSAL I (CUSTOM PROCEDURE TRAY) ×3 IMPLANT
PAD ARMBOARD 7.5X6 YLW CONV (MISCELLANEOUS) ×6 IMPLANT
SPONGE GAUZE 4X4 12PLY STER LF (GAUZE/BANDAGES/DRESSINGS) ×3 IMPLANT
SUT PROLENE 2 TP 1 (SUTURE) IMPLANT
SWAB COLLECTION DEVICE MRSA (MISCELLANEOUS) ×3 IMPLANT
TAPE CLOTH SURG 6X10 WHT LF (GAUZE/BANDAGES/DRESSINGS) ×3 IMPLANT
TUBE ANAEROBIC SPECIMEN COL (MISCELLANEOUS) ×3 IMPLANT
WATER STERILE IRR 1000ML POUR (IV SOLUTION) ×3 IMPLANT

## 2015-10-06 NOTE — Interval H&P Note (Signed)
History and Physical Interval Note:  10/06/2015 9:48 AM  Manuel Allen  has presented today for surgery, with the diagnosis of Abscess  The various methods of treatment have been discussed with the patient and family. After consideration of risks, benefits and other options for treatment, the patient has consented to  Procedure(s): WOUND EXPLORATION (N/A) as a surgical intervention .  The patient's history has been reviewed, patient examined, no change in status, stable for surgery.  I have reviewed the patient's chart and labs.  Questions were answered to the patient's satisfaction.     Waverly Ferrariickson, Geo Slone

## 2015-10-06 NOTE — Op Note (Signed)
    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 Edilia Boickson, MD, FACS  ASSIST: Virgilio FreesKim Hardy, RNFA  ANESTHESIA: Gen.   EBL: mminimal  INDICATIONS: Manuel MatasBrian L Carcione is a 35 y.o. male wwho had undergone previous 2 level anterior lumbar interbody fusion. He developed erythema over the abdominal incision and a CT scan showed a collection deep to the incision consistent with an abscess. He presents for incision and drainage.  FINDINGS: the fluid looked like lymphatic fluid which was possibly infected. Intraoperative cultures were sent.  TECHNIQUE: The patient was taken to the operating room and received a general anesthetic. The abdomen was prepped and draped in usual sterile fashion. The previous incision was opened in the middle and the dissection carried down to the fluid collection. This was  Lymphatic  Looking fluid which was fairly clear. Given that he had cellulitis of the skin is suspected he had an infected lymphocele. The wound was irrigated with copious amounts of antibiotic solution. Hemostasis was obtained the wound. This was then packed with a Kerlix soaked in normal saline. Sterile dressing was applied. The patient tolerated the procedure well and was transferred to the recovery room in stable condition. All needle and sponge counts were correct.  Waverly Ferrarihristopher Xia Stohr, MD, FACS [facility]  DATE OF DICTATION:   10/06/2015

## 2015-10-06 NOTE — Progress Notes (Signed)
Patient to OR at this time.    Nickisha Hum, RN 

## 2015-10-06 NOTE — Progress Notes (Signed)
Was contacted by Doctor this morning and told to make patient NPO for breakfast and that he would have surgery today to relive an abdominal abscess at previous surgical site.

## 2015-10-06 NOTE — H&P (View-Only) (Signed)
Patient name: Manuel Allen MRN: [id number] DOB: 1981/02/13 Sex: male   Referred by: Yetta BarreJones  Reason for referral: No chief complaint on file.   HISTORY OF PRESENT ILLNESS: Patient is well-known to me from exposure for L4-5 and L5-S1 anterior approach disc surgery on 08/23/2015. Over the past several days he had noticed some erythema and low-grade temps. Yesterday's examination with Dr. Yetta BarreJones revealed more erythema. Also some concern regarding his confusion. Was admitted for IV antibiotics and further evaluation.  Past Medical History  Diagnosis Date  . Back pain   . PONV (postoperative nausea and vomiting)   . Family history of adverse reaction to anesthesia     Patients grandmother had a difficult time waking up; patients mother was given to much anesthesia during knee surgery on 09/28/14  . Renal disorder     kidney stones  . Difficulty swallowing     Pt stated "I have difficulty swallowing my spit when I lay down at night"    Past Surgical History  Procedure Laterality Date  . Arm surgery Left     Open treatment internal fixation left forearm radius plate  . Back surgery    . Anterior lumbar fusion N/A 08/23/2015    Procedure: Lumbar four-five, Lumbar five-Sacral one ANTERIOR LUMBAR FUSION with anterior plating ;  Surgeon: Tia Alertavid S Jones, MD;  Location: [facility];  Service: Neurosurgery;  Laterality: N/A;  . Abdominal exposure N/A 08/23/2015    Procedure: ABDOMINAL EXPOSURE;  Surgeon: Larina Earthlyodd F Jami Bogdanski, MD;  Location: [facility];  Service: Vascular;  Laterality: N/A;    Social History   Social History  . Marital Status: Single    Spouse Name: N/A  . Number of Children: N/A  . Years of Education: N/A   Occupational History  . Not on file.   Social History Main Topics  . Smoking status: Current Every Day Smoker -- 1.50 packs/day for 26 years    Types: Cigarettes  . Smokeless tobacco: Current User    Types: Chew  . Alcohol Use: No  . Drug Use: No  . Sexual  Activity: Not on file   Other Topics Concern  . Not on file   Social History Narrative    Family History  Problem Relation Age of Onset  . Hypertension Mother   . Cancer Other   . Diabetes Other     Allergies as of 10/05/2015 - Review Complete 10/05/2015  Allergen Reaction Noted  . Codeine Hives 06/03/2011    No current facility-administered medications on file prior to encounter.   Current Outpatient Prescriptions on File Prior to Encounter  Medication Sig Dispense Refill  . methocarbamol (ROBAXIN) 750 MG tablet Take 1 tablet (750 mg total) by mouth every 6 (six) hours as needed for muscle spasms. 120 tablet 2  . oxyCODONE-acetaminophen (PERCOCET) 10-325 MG tablet Take 1-2 tablets by mouth every 4 (four) hours as needed. 90 tablet 0      PHYSICAL EXAMINATION:  General: The patient is a well-nourished male, in no acute distress. Vital signs are BP 114/61 mmHg  Pulse 84  Temp(Src) 98.7 F (37.1 C) (Oral)  Resp 16  SpO2 97% Abdomen is soft. The surgical incision is healed. There is surrounding erythema and some  tenderness in this area  CT scan was reviewed. This does show a subcutaneous fluid collection presumed abscess from just below the skin down to the level of the fascia. No air. This is a maximal size is 3  x 6 cm   Impression and Plan:  Subcutaneous abscess status post 2 level LIV-5 and L5-S1 disc surgery. I discussed with the patient. Explained the need for open drainage of this in the operating room today. Explained that this will need to be packed to allow closure from the bottom. He is a quite anxious for discharge. I explained this will be depending upon ability to arrange outpatient home health nursing and the expected length of time for IV antibiotic treatment. I am not not available for surgery until late this evening. Dr. Edilia Boickson has been kind enough to do the I&D in the operating room this morning.    Gretta BeganEarly, Teale Goodgame [facility] Office: [phone]

## 2015-10-06 NOTE — Anesthesia Postprocedure Evaluation (Signed)
Anesthesia Post Note  Patient: Manuel Allen  Procedure(s) Performed: Procedure(s) (LRB): WOUND EXPLORATION (Left)  Patient location during evaluation: PACU Anesthesia Type: General Level of consciousness: awake and alert Pain management: pain level controlled Vital Signs Assessment: post-procedure vital signs reviewed and stable Respiratory status: spontaneous breathing, nonlabored ventilation, respiratory function stable and patient connected to nasal cannula oxygen Cardiovascular status: blood pressure returned to baseline and stable Postop Assessment: no signs of nausea or vomiting Anesthetic complications: no    Last Vitals:  Filed Vitals:   10/06/15 1140 10/06/15 1243  BP: 110/68 113/60  Pulse: 83 83  Temp: 36.8 C 36.9 C  Resp: 15     Last Pain:  Filed Vitals:   10/06/15 1244  PainSc: Asleep                 Kennieth RadFitzgerald, Cortez Flippen E

## 2015-10-06 NOTE — Anesthesia Preprocedure Evaluation (Signed)
Anesthesia Evaluation  Patient identified by MRN, date of birth, ID band Patient awake    Reviewed: Allergy & Precautions, NPO status , Patient's Chart, lab work & pertinent test results  History of Anesthesia Complications (+) PONV  Airway Mallampati: I   Neck ROM: full    Dental  (+) Teeth Intact   Pulmonary Current Smoker,    breath sounds clear to auscultation       Cardiovascular negative cardio ROS   Rhythm:regular Rate:Normal     Neuro/Psych Anxiety negative neurological ROS     GI/Hepatic negative GI ROS, Neg liver ROS,   Endo/Other  obese  Renal/GU negative Renal ROS     Musculoskeletal  (+) Arthritis ,   Abdominal   Peds  Hematology negative hematology ROS (+)   Anesthesia Other Findings   Reproductive/Obstetrics                             Lab Results  Component Value Date   WBC 16.3* 08/24/2015   HGB 11.2* 08/24/2015   HCT 33.9* 08/24/2015   MCV 89.7 08/24/2015   PLT 183 08/24/2015   Lab Results  Component Value Date   CREATININE 0.96 08/14/2015   BUN 10 08/14/2015   NA 141 08/23/2015   K 4.4 08/23/2015   CL 107 08/14/2015   CO2 23 08/14/2015    Anesthesia Physical  Anesthesia Plan  ASA: II  Anesthesia Plan: General   Post-op Pain Management:    Induction: Intravenous  Airway Management Planned: LMA  Additional Equipment:   Intra-op Plan:   Post-operative Plan: Extubation in OR  Informed Consent: I have reviewed the patients History and Physical, chart, labs and discussed the procedure including the risks, benefits and alternatives for the proposed anesthesia with the patient or authorized representative who has indicated his/her understanding and acceptance.   Dental advisory given  Plan Discussed with: Anesthesiologist, CRNA and Surgeon  Anesthesia Plan Comments:         Anesthesia Quick Evaluation

## 2015-10-06 NOTE — Anesthesia Procedure Notes (Signed)
Procedure Name: Intubation Date/Time: 10/06/2015 10:05 AM Performed by: Marena ChancyBECKNER, Britney Captain S Pre-anesthesia Checklist: Emergency Drugs available, Timeout performed, Suction available, Patient identified and Patient being monitored Patient Re-evaluated:Patient Re-evaluated prior to inductionOxygen Delivery Method: Circle system utilized Preoxygenation: Pre-oxygenation with 100% oxygen Intubation Type: IV induction Ventilation: Mask ventilation without difficulty and Oral airway inserted - appropriate to patient size Laryngoscope Size: Hyacinth MeekerMiller and 2 Grade View: Grade I Tube type: Oral Tube size: 7.5 mm Number of attempts: 1 Placement Confirmation: ETT inserted through vocal cords under direct vision,  breath sounds checked- equal and bilateral and positive ETCO2 Tube secured with: Tape Dental Injury: Teeth and Oropharynx as per pre-operative assessment

## 2015-10-06 NOTE — Transfer of Care (Signed)
Immediate Anesthesia Transfer of Care Note  Patient: Manuel Allen  Procedure(s) Performed: Procedure(s): WOUND EXPLORATION (Left)  Patient Location: PACU  Anesthesia Type:General  Level of Consciousness: awake, alert  and oriented  Airway & Oxygen Therapy: Patient Spontanous Breathing and Patient connected to nasal cannula oxygen  Post-op Assessment: Report given to RN, Post -op Vital signs reviewed and stable and Patient moving all extremities X 4  Post vital signs: Reviewed and stable  Last Vitals:  Filed Vitals:   10/06/15 0117 10/06/15 0433  BP: 118/74 114/61  Pulse: 94 84  Temp: 37.4 C 37.1 C  Resp: 16 16    Last Pain:  Filed Vitals:   10/06/15 0926  PainSc: Asleep      Patients Stated Pain Goal: 0 (10/06/15 0745)  Complications: No apparent anesthesia complications

## 2015-10-06 NOTE — Consult Note (Signed)
Patient name: Manuel Allen MRN: [id number] DOB: 1981/02/13 Sex: male   Referred by: Yetta BarreJones  Reason for referral: No chief complaint on file.   HISTORY OF PRESENT ILLNESS: Patient is well-known to me from exposure for L4-5 and L5-S1 anterior approach disc surgery on 08/23/2015. Over the past several days he had noticed some erythema and low-grade temps. Yesterday's examination with Dr. Yetta BarreJones revealed more erythema. Also some concern regarding his confusion. Was admitted for IV antibiotics and further evaluation.  Past Medical History  Diagnosis Date  . Back pain   . PONV (postoperative nausea and vomiting)   . Family history of adverse reaction to anesthesia     Patients grandmother had a difficult time waking up; patients mother was given to much anesthesia during knee surgery on 09/28/14  . Renal disorder     kidney stones  . Difficulty swallowing     Pt stated "I have difficulty swallowing my spit when I lay down at night"    Past Surgical History  Procedure Laterality Date  . Arm surgery Left     Open treatment internal fixation left forearm radius plate  . Back surgery    . Anterior lumbar fusion N/A 08/23/2015    Procedure: Lumbar four-five, Lumbar five-Sacral one ANTERIOR LUMBAR FUSION with anterior plating ;  Surgeon: Tia Alertavid S Jones, MD;  Location: [facility];  Service: Neurosurgery;  Laterality: N/A;  . Abdominal exposure N/A 08/23/2015    Procedure: ABDOMINAL EXPOSURE;  Surgeon: Larina Earthlyodd F Raidon Swanner, MD;  Location: [facility];  Service: Vascular;  Laterality: N/A;    Social History   Social History  . Marital Status: Single    Spouse Name: N/A  . Number of Children: N/A  . Years of Education: N/A   Occupational History  . Not on file.   Social History Main Topics  . Smoking status: Current Every Day Smoker -- 1.50 packs/day for 26 years    Types: Cigarettes  . Smokeless tobacco: Current User    Types: Chew  . Alcohol Use: No  . Drug Use: No  . Sexual  Activity: Not on file   Other Topics Concern  . Not on file   Social History Narrative    Family History  Problem Relation Age of Onset  . Hypertension Mother   . Cancer Other   . Diabetes Other     Allergies as of 10/05/2015 - Review Complete 10/05/2015  Allergen Reaction Noted  . Codeine Hives 06/03/2011    No current facility-administered medications on file prior to encounter.   Current Outpatient Prescriptions on File Prior to Encounter  Medication Sig Dispense Refill  . methocarbamol (ROBAXIN) 750 MG tablet Take 1 tablet (750 mg total) by mouth every 6 (six) hours as needed for muscle spasms. 120 tablet 2  . oxyCODONE-acetaminophen (PERCOCET) 10-325 MG tablet Take 1-2 tablets by mouth every 4 (four) hours as needed. 90 tablet 0      PHYSICAL EXAMINATION:  General: The patient is a well-nourished male, in no acute distress. Vital signs are BP 114/61 mmHg  Pulse 84  Temp(Src) 98.7 F (37.1 C) (Oral)  Resp 16  SpO2 97% Abdomen is soft. The surgical incision is healed. There is surrounding erythema and some  tenderness in this area  CT scan was reviewed. This does show a subcutaneous fluid collection presumed abscess from just below the skin down to the level of the fascia. No air. This is a maximal size is 3  x 6 cm   Impression and Plan:  Subcutaneous abscess status post 2 level LIV-5 and L5-S1 disc surgery. I discussed with the patient. Explained the need for open drainage of this in the operating room today. Explained that this will need to be packed to allow closure from the bottom. He is a quite anxious for discharge. I explained this will be depending upon ability to arrange outpatient home health nursing and the expected length of time for IV antibiotic treatment. I am not not available for surgery until late this evening. Dr. Dickson has been kind enough to do the I&D in the operating room this morning.    Manuel Allen [facility]   Office: [phone]         

## 2015-10-07 LAB — CREATININE, SERUM
CREATININE: 0.66 mg/dL (ref 0.61–1.24)
GFR calc Af Amer: >60 mL/min (ref >60)

## 2015-10-07 MED ORDER — AMOXICILLIN-POT CLAVULANATE 875-125 MG PO TABS
1.0000 | ORAL_TABLET | Freq: Two times a day (BID) | ORAL | Status: DC
  Administered 2015-10-07: 1 via ORAL
  Filled 2015-10-07: qty 1

## 2015-10-07 MED ORDER — DOCUSATE SODIUM 100 MG PO CAPS: 100.0000 mg | ORAL_CAPSULE | Freq: Two times a day (BID) | ORAL | Status: DC

## 2015-10-07 MED ORDER — OXYCODONE-ACETAMINOPHEN 10-325 MG PO TABS: 1.0000 | ORAL_TABLET | ORAL | Status: DC | PRN

## 2015-10-07 MED ORDER — AMOXICILLIN-POT CLAVULANATE 875-125 MG PO TABS: 1.0000 | ORAL_TABLET | Freq: Two times a day (BID) | ORAL | Status: DC

## 2015-10-07 MED ORDER — METHOCARBAMOL 750 MG PO TABS: 750.0000 mg | ORAL_TABLET | Freq: Four times a day (QID) | ORAL | Status: DC | PRN

## 2015-10-07 NOTE — Care Management Note (Signed)
Case Management Note  Patient Details  Name: Manuel Allen MRN: [id number] Date of Birth: 01/22/1981  Subjective/Objective:                  Abdominal wall abscess Action/Plan: Discharge planning Expected Discharge Date:  10/07/15             Expected Discharge Plan:  [place]  In-House Referral:     Discharge planning Services  CM Consult  Post Acute Care Choice:    Choice offered to:  Patient, Parent  DME Arranged:  N/A DME Agency:  NA  HH Arranged:  RN [place] Agency:  [facility]  Status of Service:  Completed, signed off  If discussed at [place] of Stay Meetings, dates discussed:    Additional Comments: CM met with pt and pt's mother in room to offer choice of home health agency.  Pt chooses [facility] to render [facility] for dressing changes.  Referral called to [facility] rep, Tiffany.  Orders and face to face requested of MD.  NO other CM needs were communicated. Dellie Catholic, RN 10/07/2015, 10:05 AM

## 2015-10-07 NOTE — Progress Notes (Signed)
Patient IV infiltrated patient was suppose to get vancomycin at 0000, IV team unable to get a site, patient refused anymore sticks, MD notified orders given to hold dose he would address in am.

## 2015-10-07 NOTE — Discharge Summary (Signed)
Date of Admission: 10/05/2015  Date of Discharge: 10/07/2015  Admission Diagnosis: Wound infection  Discharge Diagnosis: Abdominal wall abscess  Procedure Performed: I&D  Attending: Tia Alertavid S Jones, MD  Hospital Course:  The patient was admitted for the above listed operation and had an uncomplicated post-operative course.  They were discharged in stable condition.  Follow up: 3 weeks    Medication List    TAKE these medications        amoxicillin-clavulanate 875-125 MG tablet  Commonly known as:  AUGMENTIN  Take 1 tablet by mouth every 12 (twelve) hours.     docusate sodium 100 MG capsule  Commonly known as:  COLACE  Take 1 capsule (100 mg total) by mouth 2 (two) times daily.     methocarbamol 750 MG tablet  Commonly known as:  ROBAXIN  Take 1 tablet (750 mg total) by mouth every 6 (six) hours as needed for muscle spasms.     methocarbamol 750 MG tablet  Commonly known as:  ROBAXIN  Take 1 tablet (750 mg total) by mouth every 6 (six) hours as needed for muscle spasms.     oxyCODONE-acetaminophen 10-325 MG tablet  Commonly known as:  PERCOCET  Take 1-2 tablets by mouth every 4 (four) hours as needed.

## 2015-10-07 NOTE — Progress Notes (Addendum)
   Daily Progress Note  Assessment/Planning: POD #1 s/p I&D abd abscess   Will need Wet-to-dry dressing to abd wound BID  Pt lost IV and newly placed IV already infiltrated  No organisms so far.  Ok with switching to broad spectrum PO abx like Augmentin DS as reported pt being discharged today  Follow up with Dr. Arbie CookeyEarly next Tuesday  Subjective  - 1 Day Post-Op  Pain ok, no fever or chills, no nausea or vomiting, tolerating PO  Objective Filed Vitals:   10/06/15 1817 10/06/15 2117 10/07/15 0106 10/07/15 0529  BP: 136/86 115/77 125/71 137/80  Pulse: 90 76 73 82  Temp: 98.6 F (37 C) 98.4 F (36.9 C) 97.8 F (36.6 C) 98.4 F (36.9 C)  TempSrc: Oral Oral Oral Oral  Resp: 20 20 20 20   SpO2: 94% 98% 95% 98%    Intake/Output Summary (Last 24 hours) at 10/07/15 0750 Last data filed at 10/06/15 1815  Gross per 24 hour  Intake 3871.25 ml  Output    405 ml  Net 3466.25 ml   GI no erythema around incision, no TTP to light touch, wound clean, no active bleeding, no smell  Laboratory 10/06/15 Gram Stain:   FEW WBC PRESENT, PREDOMINANTLY PMN  NO ORGANISMS SEEN           CBC    Component Value Date/Time   WBC 16.3* 08/24/2015 0532   HGB 11.2* 08/24/2015 0532   HCT 33.9* 08/24/2015 0532   PLT 183 08/24/2015 0532    BMET    Component Value Date/Time   NA 141 08/23/2015 1057   K 4.4 08/23/2015 1057   CL 107 08/14/2015 1357   CO2 23 08/14/2015 1357   GLUCOSE 139* 08/23/2015 1057   BUN 10 08/14/2015 1357   CREATININE 0.66 10/07/2015 0603   CALCIUM 9.2 08/14/2015 1357   GFRNONAA >60 10/07/2015 0603   GFRAA >60 10/07/2015 [id number]    Leonides SakeBrian Chen, MD [facility] [place] Office: [phone] Pager: [phone]  10/07/2015, 7:50 AM

## 2015-10-09 ENCOUNTER — Telehealth: Payer: Self-pay | Admitting: Vascular Surgery

## 2015-10-09 ENCOUNTER — Ambulatory Visit (HOSPITAL_COMMUNITY): Payer: Medicare Other

## 2015-10-09 ENCOUNTER — Encounter (HOSPITAL_COMMUNITY): Payer: Self-pay | Admitting: Vascular Surgery

## 2015-10-09 NOTE — Telephone Encounter (Signed)
Sched appt 6/27 at 4:00. Spoke to pt's mother to inform them of appt.

## 2015-10-09 NOTE — Progress Notes (Signed)
Notified Dr. Edilia Boickson of wound culture results, from recent I & D abdominal wall fluid collection of 6/23; wound culture showed "few group B Strep"  Per Dr. Edilia Boickson, will plan to leave pt. on Augmentin, and will decide on antibiotic next outpatient appt.  Noted next f/u appt. scheduled for 10/10/15.

## 2015-10-09 NOTE — Telephone Encounter (Signed)
-----   Message from Sharee PimpleMarilyn K McChesney, RN sent at 10/09/2015  9:57 AM EDT ----- Regarding: ASAP with TFE appt   ----- Message -----    From: Fransisco HertzBrian L Chen, MD    Sent: 10/07/2015   8:00 AM      [address]  Manuel MatasBrian L Borger [id number] 03/26/1981  Needs appt with Dr. Arbie CookeyEarly in next 1-2 weeks for wound check

## 2015-10-10 ENCOUNTER — Ambulatory Visit (INDEPENDENT_AMBULATORY_CARE_PROVIDER_SITE_OTHER): Payer: Medicare Other | Admitting: Vascular Surgery

## 2015-10-10 ENCOUNTER — Encounter: Payer: Self-pay | Admitting: Vascular Surgery

## 2015-10-10 VITALS — BP 125/80 | HR 80 | Temp 97.8°F | Resp 16 | Ht 72.0 in | Wt 233.0 lb

## 2015-10-10 DIAGNOSIS — T814XXD Infection following a procedure, subsequent encounter: Secondary | ICD-10-CM

## 2015-10-10 DIAGNOSIS — IMO0001 Reserved for inherently not codable concepts without codable children: Secondary | ICD-10-CM

## 2015-10-10 DIAGNOSIS — M5137 Other intervertebral disc degeneration, lumbosacral region: Secondary | ICD-10-CM

## 2015-10-10 NOTE — Progress Notes (Signed)
   Patient name: Manuel Allen MRN: [id number] DOB: [birth year] Sex: male  REASON FOR VISIT: Here today for follow-up of drainage of postop wound infection  HPI: Manuel Allen is a 35 y.o. male status post L4-5 anterior exposure for disc surgery on May 10. He presented with the drainage from his incision CT confirmed an abscess down to the fascia. He had operative debridement of this on 10/06/2015. Had open packing of this and was discharged home. The wound culture grew group B strep with the Augmentin for coverage.  Current Outpatient Prescriptions  Medication Sig Dispense Refill  . amoxicillin-clavulanate (AUGMENTIN) 875-125 MG tablet Take 1 tablet by mouth every 12 (twelve) hours. 28 tablet 0  . docusate sodium (COLACE) 100 MG capsule Take 1 capsule (100 mg total) by mouth 2 (two) times daily. 30 capsule 0  . methocarbamol (ROBAXIN) 750 MG tablet Take 1 tablet (750 mg total) by mouth every 6 (six) hours as needed for muscle spasms. 120 tablet 2  . oxyCODONE-acetaminophen (PERCOCET) 10-325 MG tablet Take 1-2 tablets by mouth every 4 (four) hours as needed. 90 tablet 0   No current facility-administered medications for this visit.      PHYSICAL EXAM: Filed Vitals:   10/10/15 1556  BP: 125/80  Pulse: 80  Temp: 97.8 F (36.6 C)  TempSrc: Oral  Resp: 16  Height: 6' (1.829 m)  Weight: 233 lb (105.688 kg)  SpO2: 98%    GENERAL: The patient is a well-nourished male, in no acute distress. The vital signs are documented above.  Abdominal dressing was removed. He has excellent granulation tissue with no evidence of ongoing purulence and no surrounding erythema. Aunt is a CMA and she is doing dressing changes and doing an outstanding job.   MEDICAL ISSUES: Continue his antibiotic coverage. I will see him again in 2 weeks for continued follow-up.  Larina Earthlyodd F. Aislee Landgren, MD FACS [facility] Office Tel [phone] Pager  [phone]

## 2015-10-11 LAB — AEROBIC/ANAEROBIC CULTURE (SURGICAL/DEEP WOUND)

## 2015-10-11 LAB — AEROBIC/ANAEROBIC CULTURE W GRAM STAIN (SURGICAL/DEEP WOUND)

## 2015-10-18 ENCOUNTER — Encounter: Payer: Self-pay | Admitting: Vascular Surgery

## 2015-10-24 ENCOUNTER — Ambulatory Visit (INDEPENDENT_AMBULATORY_CARE_PROVIDER_SITE_OTHER): Payer: Medicare Other | Admitting: Vascular Surgery

## 2015-10-24 ENCOUNTER — Encounter: Payer: Self-pay | Admitting: Vascular Surgery

## 2015-10-24 VITALS — BP 118/79 | HR 74 | Temp 97.9°F | Ht 72.0 in | Wt 238.2 lb

## 2015-10-24 DIAGNOSIS — IMO0001 Reserved for inherently not codable concepts without codable children: Secondary | ICD-10-CM

## 2015-10-24 DIAGNOSIS — T814XXD Infection following a procedure, subsequent encounter: Secondary | ICD-10-CM

## 2015-10-24 NOTE — Progress Notes (Signed)
   Patient name: Manuel Allen MRN: [id number] DOB: 1980/10/30 Sex: male  REASON FOR VISIT: Wound follow-up  HPI: Manuel MatasBrian L Mongeau is a 35 y.o. male here today for follow-up of postoperative wound infection after 2 level anterior exposure. His family is taking care of local wound care and is doing an excellent job. He has had marked contraction of his open wound. There is excellent granulation tissue at the base of all of this with no evidence of infection.  Current Outpatient Prescriptions  Medication Sig Dispense Refill  . docusate sodium (COLACE) 100 MG capsule Take 1 capsule (100 mg total) by mouth 2 (two) times daily. 30 capsule 0  . HYDROmorphone (DILAUDID) 2 MG tablet Take 2 mg by mouth every 6 (six) hours as needed for severe pain.    . methocarbamol (ROBAXIN) 750 MG tablet Take 1 tablet (750 mg total) by mouth every 6 (six) hours as needed for muscle spasms. 120 tablet 2  . amoxicillin-clavulanate (AUGMENTIN) 875-125 MG tablet Take 1 tablet by mouth every 12 (twelve) hours. (Patient not taking: Reported on 10/24/2015) 28 tablet 0  . oxyCODONE-acetaminophen (PERCOCET) 10-325 MG tablet Take 1-2 tablets by mouth every 4 (four) hours as needed. (Patient not taking: Reported on 10/24/2015) 90 tablet 0   No current facility-administered medications for this visit.     PHYSICAL EXAM: Filed Vitals:   10/24/15 1410  BP: 118/79  Pulse: 74  Temp: 97.9 F (36.6 C)  TempSrc: Oral  Height: 6' (1.829 m)  Weight: 238 lb 3.2 oz (108.047 kg)  SpO2: 96%    GENERAL: The patient is a well-nourished male, in no acute distress. The vital signs are documented above. Excellent granulation base of his open wound in his left paramedian incision. This was probed with Q-tip with no evidence of tracking and no surrounding erythema  MEDICAL ISSUES: Stable overall. Continue normal saline wet-to-dry dressings. Suspect that this will be closed within another 3-4 weeks. Has an  appointment to see me again in one month for final follow-up.  Larina Earthlyodd F. Early, MD FACS [facility] Office Tel [phone] Pager [phone]

## 2015-11-21 ENCOUNTER — Ambulatory Visit: Payer: Medicare Other | Admitting: Vascular Surgery

## 2016-02-02 ENCOUNTER — Other Ambulatory Visit (HOSPITAL_COMMUNITY): Payer: Self-pay | Admitting: Neurological Surgery

## 2016-02-02 DIAGNOSIS — M545 Low back pain, unspecified: Secondary | ICD-10-CM

## 2016-02-14 ENCOUNTER — Ambulatory Visit (HOSPITAL_COMMUNITY)
Admission: RE | Admit: 2016-02-14 | Discharge: 2016-02-14 | Disposition: A | Discharge status: Home / Self Care | Payer: Medicare Other | Source: Ambulatory Visit | Attending: Neurological Surgery | Admitting: Neurological Surgery

## 2016-02-14 DIAGNOSIS — M5127 Other intervertebral disc displacement, lumbosacral region: Secondary | ICD-10-CM | POA: Diagnosis not present

## 2016-02-14 DIAGNOSIS — M48061 Spinal stenosis, lumbar region without neurogenic claudication: Secondary | ICD-10-CM | POA: Insufficient documentation

## 2016-02-14 DIAGNOSIS — M4316 Spondylolisthesis, lumbar region: Secondary | ICD-10-CM | POA: Diagnosis not present

## 2016-02-14 DIAGNOSIS — M545 Low back pain, unspecified: Secondary | ICD-10-CM

## 2016-02-14 DIAGNOSIS — S32048A Other fracture of fourth lumbar vertebra, initial encounter for closed fracture: Secondary | ICD-10-CM | POA: Insufficient documentation

## 2016-02-14 DIAGNOSIS — M5126 Other intervertebral disc displacement, lumbar region: Secondary | ICD-10-CM | POA: Diagnosis not present

## 2016-02-14 DIAGNOSIS — X58XXXA Exposure to other specified factors, initial encounter: Secondary | ICD-10-CM | POA: Diagnosis not present

## 2016-02-14 DIAGNOSIS — M1288 Other specific arthropathies, not elsewhere classified, other specified site: Secondary | ICD-10-CM | POA: Diagnosis not present

## 2016-02-14 DIAGNOSIS — Z981 Arthrodesis status: Secondary | ICD-10-CM | POA: Diagnosis not present

## 2016-02-14 DIAGNOSIS — M960 Pseudarthrosis after fusion or arthrodesis: Secondary | ICD-10-CM | POA: Diagnosis not present

## 2016-03-05 ENCOUNTER — Other Ambulatory Visit: Payer: Self-pay | Admitting: Neurological Surgery

## 2016-06-05 ENCOUNTER — Encounter (HOSPITAL_COMMUNITY): Payer: Self-pay

## 2016-06-05 ENCOUNTER — Encounter (HOSPITAL_COMMUNITY)
Admission: RE | Admit: 2016-06-05 | Discharge: 2016-06-05 | Disposition: A | Discharge status: Home / Self Care | Payer: Medicare Other | Source: Ambulatory Visit | Attending: Neurological Surgery | Admitting: Neurological Surgery

## 2016-06-05 DIAGNOSIS — M8448XA Pathological fracture, other site, initial encounter for fracture: Secondary | ICD-10-CM | POA: Diagnosis not present

## 2016-06-05 DIAGNOSIS — Z01818 Encounter for other preprocedural examination: Secondary | ICD-10-CM | POA: Diagnosis present

## 2016-06-05 HISTORY — DX: Personal history of urinary calculi: Z87.442

## 2016-06-05 LAB — CBC
HEMATOCRIT: 42.1 % (ref 39.0–52.0)
Hemoglobin: 14.4 g/dL (ref 13.0–17.0)
MCH: 29.8 pg (ref 26.0–34.0)
MCHC: 34.2 g/dL (ref 30.0–36.0)
MCV: 87.2 fL (ref 78.0–100.0)
Platelets: 215 10*3/uL (ref 150–400)
RBC: 4.83 MIL/uL (ref 4.22–5.81)
RDW: 13.3 % (ref 11.5–15.5)
WBC: 11.3 10*3/uL — AB (ref 4.0–10.5)

## 2016-06-05 LAB — BASIC METABOLIC PANEL
Anion gap: 7 (ref 5–15)
BUN: 7 mg/dL (ref 6–20)
CHLORIDE: 107 mmol/L (ref 101–111)
CO2: 24 mmol/L (ref 22–32)
Calcium: 9.4 mg/dL (ref 8.9–10.3)
Creatinine, Ser: 0.98 mg/dL (ref 0.61–1.24)
GFR calc Af Amer: >60 mL/min (ref >60)
GFR calc non Af Amer: >60 mL/min (ref >60)
GLUCOSE: 97 mg/dL (ref 65–99)
POTASSIUM: 4.2 mmol/L (ref 3.5–5.1)
Sodium: 138 mmol/L (ref 135–145)

## 2016-06-05 LAB — SURGICAL PCR SCREEN
MRSA, PCR: NEGATIVE
Staphylococcus aureus: POSITIVE — AB

## 2016-06-05 LAB — TYPE AND SCREEN
ABO/RH(D): O POS
Antibody Screen: NEGATIVE

## 2016-06-05 LAB — PROTIME-INR
INR: 1
Prothrombin Time: 13.2 seconds (ref 11.4–15.2)

## 2016-06-05 MED ORDER — CHLORHEXIDINE GLUCONATE CLOTH 2 % EX PADS: 6.0000 | MEDICATED_PAD | Freq: Once | CUTANEOUS | Status: DC

## 2016-06-05 NOTE — Pre-Procedure Instructions (Signed)
Manuel Allen  06/05/2016      [name] [place], [place] - [address] [place] [id number] Phone: [phone] Fax: [phone]    Your procedure is scheduled on Thurs, Mar 1 @ 9:45 AM  [facility] at 7:45 AM  Call this number if you have problems the morning of surgery:  [phone]   Remember:  Do not eat food or drink liquids after midnight.  Take these medicines the morning of surgery with A SIP OF WATER Pain Pill(if needed)              No Goody's,BC's,Aleve,Advil,Motrin,Ibuprofen,Aspirin,Fish Oil,or any Herbal Medications.    Do not wear jewelry.  Do not wear lotions, powders,colognes, or deoderant.             Men may shave face and neck.  Do not bring valuables to the hospital.  [facility] is not responsible for any belongings or valuables.  Contacts, dentures or bridgework may not be worn into surgery.  Leave your suitcase in the car.  After surgery it may be brought to your room.  For patients admitted to the hospital, discharge time will be determined by your treatment team.  Patients discharged the day of surgery will not be allowed to drive home.    [facility]  Before surgery, you can play an important role.  Because skin is not sterile, your skin needs to be as free of germs as possible.  You can reduce the number of germs on you skin by washing with CHG (chlorahexidine gluconate) soap before surgery.  CHG is an antiseptic cleaner which kills germs and bonds with the skin to continue killing germs even after washing.  Please DO NOT use if you have an allergy to CHG or antibacterial soaps.  If your skin becomes reddened/irritated stop using the CHG and inform your nurse when you arrive at Short Stay.  Do not shave (including legs and underarms) for at least 48 hours prior to the first CHG shower.  You may shave your face.  Please follow these  instructions carefully:   1.  Shower with CHG Soap the night before surgery and the                                morning of Surgery.  2.  If you choose to wash your hair, wash your hair first as usual with your       normal shampoo.  3.  After you shampoo, rinse your hair and body thoroughly to remove the                      Shampoo.  4.  Use CHG as you would any other liquid soap.  You can apply chg directly       to the skin and wash gently with scrungie or a clean washcloth.  5.  Apply the CHG Soap to your body ONLY FROM THE NECK DOWN.        Do not use on open wounds or open sores.  Avoid contact with your eyes,       ears, mouth and genitals (private parts).  Wash genitals (private parts)       with your normal soap.  6.  Wash thoroughly, paying special attention to the area where your surgery  will be performed.  7.  Thoroughly rinse your body with warm water from the neck down.  8.  DO NOT shower/wash with your normal soap after using and rinsing off       the CHG Soap.  9.  Pat yourself dry with a clean towel.            10.  Wear clean pajamas.            11.  Place clean sheets on your bed the night of your first shower and do not        sleep with pets.  Day of Surgery  Do not apply any lotions/deoderants the morning of surgery.  Please wear clean clothes to the hospital/surgery center.    Please read over the following fact sheets that you were given. Pain Booklet, Coughing and Deep Breathing, MRSA Information and Surgical Site Infection Prevention

## 2016-06-13 ENCOUNTER — Inpatient Hospital Stay (HOSPITAL_COMMUNITY): Payer: Medicare Other

## 2016-06-13 ENCOUNTER — Inpatient Hospital Stay (HOSPITAL_COMMUNITY)
Admission: RE | Admit: 2016-06-13 | Discharge: 2016-06-14 | DRG: 460 | Disposition: A | Discharge status: Home / Self Care | Payer: Medicare Other | Source: Ambulatory Visit | Attending: Neurological Surgery | Admitting: Neurological Surgery

## 2016-06-13 ENCOUNTER — Encounter (HOSPITAL_COMMUNITY): Payer: Self-pay | Admitting: *Deleted

## 2016-06-13 ENCOUNTER — Encounter (HOSPITAL_COMMUNITY)
Admission: RE | Disposition: A | Discharge status: Home / Self Care | Payer: Self-pay | Source: Ambulatory Visit | Attending: Neurological Surgery

## 2016-06-13 ENCOUNTER — Inpatient Hospital Stay (HOSPITAL_COMMUNITY): Payer: Medicare Other | Admitting: Certified Registered Nurse Anesthetist

## 2016-06-13 DIAGNOSIS — M549 Dorsalgia, unspecified: Secondary | ICD-10-CM | POA: Diagnosis present

## 2016-06-13 DIAGNOSIS — M960 Pseudarthrosis after fusion or arthrodesis: Secondary | ICD-10-CM | POA: Diagnosis present

## 2016-06-13 DIAGNOSIS — Z8249 Family history of ischemic heart disease and other diseases of the circulatory system: Secondary | ICD-10-CM

## 2016-06-13 DIAGNOSIS — Z885 Allergy status to narcotic agent status: Secondary | ICD-10-CM

## 2016-06-13 DIAGNOSIS — Z981 Arthrodesis status: Secondary | ICD-10-CM

## 2016-06-13 DIAGNOSIS — Y793 Surgical instruments, materials and orthopedic devices (including sutures) associated with adverse incidents: Secondary | ICD-10-CM | POA: Diagnosis present

## 2016-06-13 DIAGNOSIS — Y838 Other surgical procedures as the cause of abnormal reaction of the patient, or of later complication, without mention of misadventure at the time of the procedure: Secondary | ICD-10-CM | POA: Diagnosis present

## 2016-06-13 DIAGNOSIS — Z87891 Personal history of nicotine dependence: Secondary | ICD-10-CM

## 2016-06-13 DIAGNOSIS — Z419 Encounter for procedure for purposes other than remedying health state, unspecified: Secondary | ICD-10-CM

## 2016-06-13 HISTORY — DX: Low back pain: M54.5

## 2016-06-13 HISTORY — PX: LAMINECTOMY WITH POSTERIOR LATERAL ARTHRODESIS LEVEL 2: SHX6336

## 2016-06-13 HISTORY — DX: Low back pain, unspecified: M54.50

## 2016-06-13 HISTORY — PX: POSTERIOR LUMBAR FUSION: SHX6036

## 2016-06-13 HISTORY — DX: Other chronic pain: G89.29

## 2016-06-13 SURGERY — LAMINECTOMY WITH POSTERIOR LATERAL ARTHRODESIS LEVEL 2
Anesthesia: General | Site: Back

## 2016-06-13 MED ORDER — MORPHINE SULFATE (PF) 2 MG/ML IV SOLN: 2.0000 mg | INTRAVENOUS | Status: DC | PRN

## 2016-06-13 MED ORDER — ONDANSETRON HCL 4 MG/2ML IJ SOLN
INTRAMUSCULAR | Status: DC | PRN
  Administered 2016-06-13: 4 mg via INTRAVENOUS

## 2016-06-13 MED ORDER — BUPIVACAINE HCL (PF) 0.25 % IJ SOLN
INTRAMUSCULAR | Status: DC | PRN
Start: 2016-06-13 — End: 2016-06-13
  Administered 2016-06-13: 7 mL

## 2016-06-13 MED ORDER — MEPERIDINE HCL 25 MG/ML IJ SOLN: 6.2500 mg | INTRAMUSCULAR | Status: DC | PRN

## 2016-06-13 MED ORDER — METHOCARBAMOL 1000 MG/10ML IJ SOLN
500.0000 mg | Freq: Four times a day (QID) | INTRAVENOUS | Status: DC | PRN
  Filled 2016-06-13: qty 5

## 2016-06-13 MED ORDER — THROMBIN 20000 UNITS EX SOLR
CUTANEOUS | Status: DC | PRN
  Administered 2016-06-13: 20 mL via TOPICAL

## 2016-06-13 MED ORDER — SUGAMMADEX SODIUM 200 MG/2ML IV SOLN
INTRAVENOUS | Status: DC | PRN
  Administered 2016-06-13 (×2): 100 mg via INTRAVENOUS

## 2016-06-13 MED ORDER — CEFAZOLIN SODIUM-DEXTROSE 2-4 GM/100ML-% IV SOLN
2.0000 g | INTRAVENOUS | Status: AC
  Administered 2016-06-13: 2 g via INTRAVENOUS

## 2016-06-13 MED ORDER — SODIUM CHLORIDE 0.9% FLUSH: 3.0000 mL | INTRAVENOUS | Status: DC | PRN

## 2016-06-13 MED ORDER — PROPOFOL 10 MG/ML IV BOLUS
INTRAVENOUS | Status: AC
  Filled 2016-06-13: qty 20

## 2016-06-13 MED ORDER — MENTHOL 3 MG MT LOZG: 1.0000 | LOZENGE | OROMUCOSAL | Status: DC | PRN

## 2016-06-13 MED ORDER — CELECOXIB 200 MG PO CAPS
200.0000 mg | ORAL_CAPSULE | Freq: Two times a day (BID) | ORAL | Status: DC
  Administered 2016-06-13 – 2016-06-14 (×2): 200 mg via ORAL
  Filled 2016-06-13 (×3): qty 1

## 2016-06-13 MED ORDER — ACETAMINOPHEN 325 MG PO TABS: 650.0000 mg | ORAL_TABLET | ORAL | Status: DC | PRN

## 2016-06-13 MED ORDER — LACTATED RINGERS IV SOLN
INTRAVENOUS | Status: DC
  Administered 2016-06-13 (×2): via INTRAVENOUS

## 2016-06-13 MED ORDER — ACETAMINOPHEN 650 MG RE SUPP: 650.0000 mg | RECTAL | Status: DC | PRN

## 2016-06-13 MED ORDER — SODIUM CHLORIDE 0.9% FLUSH
3.0000 mL | Freq: Two times a day (BID) | INTRAVENOUS | Status: DC
  Administered 2016-06-14: 3 mL via INTRAVENOUS

## 2016-06-13 MED ORDER — HYDROMORPHONE HCL 1 MG/ML IJ SOLN
INTRAMUSCULAR | Status: AC
  Administered 2016-06-13: 0.5 mg via INTRAVENOUS
  Filled 2016-06-13: qty 1

## 2016-06-13 MED ORDER — FENTANYL CITRATE (PF) 100 MCG/2ML IJ SOLN
INTRAMUSCULAR | Status: AC
  Filled 2016-06-13: qty 2

## 2016-06-13 MED ORDER — 0.9 % SODIUM CHLORIDE (POUR BTL) OPTIME
TOPICAL | Status: DC | PRN
  Administered 2016-06-13: 1000 mL

## 2016-06-13 MED ORDER — PROPOFOL 500 MG/50ML IV EMUL
INTRAVENOUS | Status: DC | PRN
  Administered 2016-06-13: 50 ug/kg/min via INTRAVENOUS

## 2016-06-13 MED ORDER — CEFAZOLIN SODIUM-DEXTROSE 2-4 GM/100ML-% IV SOLN
2.0000 g | Freq: Three times a day (TID) | INTRAVENOUS | Status: AC
  Administered 2016-06-13 – 2016-06-14 (×2): 2 g via INTRAVENOUS
  Filled 2016-06-13 (×2): qty 100

## 2016-06-13 MED ORDER — POTASSIUM CHLORIDE IN NACL 20-0.9 MEQ/L-% IV SOLN
INTRAVENOUS | Status: DC
  Administered 2016-06-13: 21:00:00 via INTRAVENOUS
  Filled 2016-06-13 (×2): qty 1000

## 2016-06-13 MED ORDER — ROCURONIUM BROMIDE 100 MG/10ML IV SOLN
INTRAVENOUS | Status: DC | PRN
  Administered 2016-06-13: 30 mg via INTRAVENOUS
  Administered 2016-06-13: 50 mg via INTRAVENOUS

## 2016-06-13 MED ORDER — THROMBIN 5000 UNITS EX SOLR
CUTANEOUS | Status: DC | PRN
Start: 2016-06-13 — End: 2016-06-13
  Administered 2016-06-13: 5 mL via TOPICAL

## 2016-06-13 MED ORDER — PROMETHAZINE HCL 25 MG/ML IJ SOLN: 6.2500 mg | INTRAMUSCULAR | Status: DC | PRN

## 2016-06-13 MED ORDER — DEXAMETHASONE SODIUM PHOSPHATE 10 MG/ML IJ SOLN
INTRAMUSCULAR | Status: AC
  Filled 2016-06-13: qty 1

## 2016-06-13 MED ORDER — VANCOMYCIN HCL 1000 MG IV SOLR
INTRAVENOUS | Status: DC | PRN
Start: 2016-06-13 — End: 2016-06-13
  Administered 2016-06-13: 1000 mg

## 2016-06-13 MED ORDER — LIDOCAINE HCL (CARDIAC) 20 MG/ML IV SOLN
INTRAVENOUS | Status: DC | PRN
  Administered 2016-06-13: 60 mg via INTRAVENOUS

## 2016-06-13 MED ORDER — SODIUM CHLORIDE 0.9 % IR SOLN
Status: DC | PRN
  Administered 2016-06-13: 500 mL

## 2016-06-13 MED ORDER — MIDAZOLAM HCL 2 MG/2ML IJ SOLN
INTRAMUSCULAR | Status: AC
  Filled 2016-06-13: qty 2

## 2016-06-13 MED ORDER — VANCOMYCIN HCL 1000 MG IV SOLR
INTRAVENOUS | Status: AC
  Filled 2016-06-13: qty 1000

## 2016-06-13 MED ORDER — LACTATED RINGERS IV SOLN: INTRAVENOUS | Status: DC

## 2016-06-13 MED ORDER — CEFAZOLIN SODIUM-DEXTROSE 2-4 GM/100ML-% IV SOLN
INTRAVENOUS | Status: AC
  Filled 2016-06-13: qty 100

## 2016-06-13 MED ORDER — ONDANSETRON HCL 4 MG/2ML IJ SOLN: 4.0000 mg | Freq: Four times a day (QID) | INTRAMUSCULAR | Status: DC | PRN

## 2016-06-13 MED ORDER — SODIUM CHLORIDE 0.9 % IV SOLN: 250.0000 mL | INTRAVENOUS | Status: DC

## 2016-06-13 MED ORDER — FENTANYL CITRATE (PF) 100 MCG/2ML IJ SOLN
INTRAMUSCULAR | Status: DC | PRN
Start: 2016-06-13 — End: 2016-06-13
  Administered 2016-06-13 (×2): 50 ug via INTRAVENOUS
  Administered 2016-06-13: 25 ug via INTRAVENOUS
  Administered 2016-06-13 (×2): 50 ug via INTRAVENOUS
  Administered 2016-06-13: 25 ug via INTRAVENOUS
  Administered 2016-06-13: 50 ug via INTRAVENOUS

## 2016-06-13 MED ORDER — PANTOPRAZOLE SODIUM 40 MG PO TBEC
40.0000 mg | DELAYED_RELEASE_TABLET | Freq: Two times a day (BID) | ORAL | Status: DC
  Administered 2016-06-14 (×2): 40 mg via ORAL
  Filled 2016-06-13 (×2): qty 1

## 2016-06-13 MED ORDER — MIDAZOLAM HCL 5 MG/5ML IJ SOLN
INTRAMUSCULAR | Status: DC | PRN
  Administered 2016-06-13 (×2): 1 mg via INTRAVENOUS

## 2016-06-13 MED ORDER — DEXAMETHASONE SODIUM PHOSPHATE 10 MG/ML IJ SOLN
10.0000 mg | INTRAMUSCULAR | Status: AC
  Administered 2016-06-13: 10 mg via INTRAVENOUS

## 2016-06-13 MED ORDER — OXYCODONE HCL 5 MG PO TABS
5.0000 mg | ORAL_TABLET | ORAL | Status: DC | PRN
  Administered 2016-06-13 – 2016-06-14 (×4): 10 mg via ORAL
  Filled 2016-06-13 (×4): qty 2

## 2016-06-13 MED ORDER — BUPIVACAINE HCL (PF) 0.25 % IJ SOLN
INTRAMUSCULAR | Status: AC
  Filled 2016-06-13: qty 30

## 2016-06-13 MED ORDER — KETOROLAC TROMETHAMINE 30 MG/ML IJ SOLN
INTRAMUSCULAR | Status: DC | PRN
  Administered 2016-06-13: 30 mg via INTRAVENOUS

## 2016-06-13 MED ORDER — ONDANSETRON HCL 4 MG PO TABS: 4.0000 mg | ORAL_TABLET | Freq: Four times a day (QID) | ORAL | Status: DC | PRN

## 2016-06-13 MED ORDER — ACETAMINOPHEN 10 MG/ML IV SOLN
INTRAVENOUS | Status: AC
  Filled 2016-06-13: qty 100

## 2016-06-13 MED ORDER — SENNA 8.6 MG PO TABS
1.0000 | ORAL_TABLET | Freq: Two times a day (BID) | ORAL | Status: DC
  Administered 2016-06-13 – 2016-06-14 (×2): 8.6 mg via ORAL
  Filled 2016-06-13 (×2): qty 1

## 2016-06-13 MED ORDER — HYDROMORPHONE HCL 1 MG/ML IJ SOLN
0.2500 mg | INTRAMUSCULAR | Status: DC | PRN
  Administered 2016-06-13 (×2): 0.5 mg via INTRAVENOUS

## 2016-06-13 MED ORDER — THROMBIN 5000 UNITS EX SOLR
CUTANEOUS | Status: AC
  Filled 2016-06-13: qty 5000

## 2016-06-13 MED ORDER — METHOCARBAMOL 500 MG PO TABS
500.0000 mg | ORAL_TABLET | Freq: Four times a day (QID) | ORAL | Status: DC | PRN
  Administered 2016-06-14: 500 mg via ORAL
  Filled 2016-06-13 (×2): qty 1

## 2016-06-13 MED ORDER — THROMBIN 20000 UNITS EX SOLR
CUTANEOUS | Status: AC
  Filled 2016-06-13: qty 20000

## 2016-06-13 MED ORDER — ACETAMINOPHEN 10 MG/ML IV SOLN
INTRAVENOUS | Status: DC | PRN
  Administered 2016-06-13: 1000 mg via INTRAVENOUS

## 2016-06-13 MED ORDER — PHENOL 1.4 % MT LIQD: 1.0000 | OROMUCOSAL | Status: DC | PRN

## 2016-06-13 MED ORDER — ARTIFICIAL TEARS OP OINT
TOPICAL_OINTMENT | OPHTHALMIC | Status: DC | PRN
  Administered 2016-06-13: 1 via OPHTHALMIC

## 2016-06-13 SURGICAL SUPPLY — 64 items
BAG DECANTER FOR FLEXI CONT (MISCELLANEOUS) ×3 IMPLANT
BASKET BONE COLLECTION (BASKET) ×3 IMPLANT
BENZOIN TINCTURE PRP APPL 2/3 (GAUZE/BANDAGES/DRESSINGS) ×3 IMPLANT
BLADE CLIPPER SURG (BLADE) IMPLANT
BONE CANC CHIPS 40CC CAN1/2 (Bone Implant) ×3 IMPLANT
BUR MATCHSTICK NEURO 3.0 LAGG (BURR) ×3 IMPLANT
CANISTER SUCT 3000ML PPV (MISCELLANEOUS) ×3 IMPLANT
CAP RELINE MOD TULIP RMM (Cap) ×12 IMPLANT
CARTRIDGE OIL MAESTRO DRILL (MISCELLANEOUS) ×1 IMPLANT
CHIPS CANC BONE 40CC CAN1/2 (Bone Implant) ×1 IMPLANT
CLIP NEUROVISION LG (CLIP) ×3 IMPLANT
CLOSURE WOUND 1/2 X4 (GAUZE/BANDAGES/DRESSINGS) ×1
CONT SPEC 4OZ CLIKSEAL STRL BL (MISCELLANEOUS) ×3 IMPLANT
COVER BACK TABLE 60X90IN (DRAPES) ×3 IMPLANT
DIFFUSER DRILL AIR PNEUMATIC (MISCELLANEOUS) ×3 IMPLANT
DRAPE C-ARM 42X72 X-RAY (DRAPES) IMPLANT
DRAPE LAPAROTOMY 100X72X124 (DRAPES) ×3 IMPLANT
DRAPE POUCH INSTRU U-SHP 10X18 (DRAPES) ×3 IMPLANT
DRAPE SURG 17X23 STRL (DRAPES) ×3 IMPLANT
DRSG OPSITE POSTOP 4X6 (GAUZE/BANDAGES/DRESSINGS) ×3 IMPLANT
DURAPREP 26ML APPLICATOR (WOUND CARE) ×3 IMPLANT
ELECT BLADE 4.0 EZ CLEAN MEGAD (MISCELLANEOUS) ×3
ELECT REM PT RETURN 9FT ADLT (ELECTROSURGICAL) ×3
ELECTRODE BLDE 4.0 EZ CLN MEGD (MISCELLANEOUS) ×1 IMPLANT
ELECTRODE REM PT RTRN 9FT ADLT (ELECTROSURGICAL) ×1 IMPLANT
EVACUATOR 1/8 PVC DRAIN (DRAIN) IMPLANT
GAUZE SPONGE 4X4 16PLY XRAY LF (GAUZE/BANDAGES/DRESSINGS) IMPLANT
GLOVE BIO SURGEON STRL SZ7 (GLOVE) ×3 IMPLANT
GLOVE BIO SURGEON STRL SZ8 (GLOVE) ×9 IMPLANT
GLOVE BIOGEL PI IND STRL 7.0 (GLOVE) ×1 IMPLANT
GLOVE BIOGEL PI IND STRL 8.5 (GLOVE) ×1 IMPLANT
GLOVE BIOGEL PI INDICATOR 7.0 (GLOVE) ×2
GLOVE BIOGEL PI INDICATOR 8.5 (GLOVE) ×2
GOWN STRL REUS W/ TWL LRG LVL3 (GOWN DISPOSABLE) ×1 IMPLANT
GOWN STRL REUS W/ TWL XL LVL3 (GOWN DISPOSABLE) ×3 IMPLANT
GOWN STRL REUS W/TWL 2XL LVL3 (GOWN DISPOSABLE) IMPLANT
GOWN STRL REUS W/TWL LRG LVL3 (GOWN DISPOSABLE) ×2
GOWN STRL REUS W/TWL XL LVL3 (GOWN DISPOSABLE) ×6
HEMOSTAT POWDER KIT SURGIFOAM (HEMOSTASIS) ×6 IMPLANT
KIT BASIN OR (CUSTOM PROCEDURE TRAY) ×3 IMPLANT
KIT ROOM TURNOVER OR (KITS) ×3 IMPLANT
MODULE NVM5 NEXT GEN EMG (NEEDLE) ×3 IMPLANT
NEEDLE ASP BONE MRW 8GX15 (NEEDLE) ×3 IMPLANT
NEEDLE HYPO 25X1 1.5 SAFETY (NEEDLE) ×3 IMPLANT
NS IRRIG 1000ML POUR BTL (IV SOLUTION) ×3 IMPLANT
OIL CARTRIDGE MAESTRO DRILL (MISCELLANEOUS) ×3
PACK LAMINECTOMY NEURO (CUSTOM PROCEDURE TRAY) ×3 IMPLANT
PAD ARMBOARD 7.5X6 YLW CONV (MISCELLANEOUS) ×9 IMPLANT
ROD RELINE COCR LORD 5.0X70MM (Rod) ×4 IMPLANT
SCREW LOCK RSS 4.5/5.0MM (Screw) ×18 IMPLANT
SCREW RELINE RMM 6.5X40 4S (Screw) ×6 IMPLANT
SHANK RELINE MOD 5.5X40 (Screw) ×12 IMPLANT
SPONGE LAP 4X18 X RAY DECT (DISPOSABLE) IMPLANT
SPONGE SURGIFOAM ABS GEL 100 (HEMOSTASIS) ×3 IMPLANT
STRIP CLOSURE SKIN 1/2X4 (GAUZE/BANDAGES/DRESSINGS) ×2 IMPLANT
SUT VIC AB 0 CT1 18XCR BRD8 (SUTURE) ×2 IMPLANT
SUT VIC AB 0 CT1 8-18 (SUTURE) ×4
SUT VIC AB 2-0 CP2 18 (SUTURE) ×3 IMPLANT
SUT VIC AB 3-0 SH 8-18 (SUTURE) ×9 IMPLANT
SYR CONTROL 10ML LL (SYRINGE) ×3 IMPLANT
TOWEL GREEN STERILE (TOWEL DISPOSABLE) ×2 IMPLANT
TOWEL GREEN STERILE FF (TOWEL DISPOSABLE) ×2 IMPLANT
TRAY FOLEY W/METER SILVER 16FR (SET/KITS/TRAYS/PACK) ×3 IMPLANT
WATER STERILE IRR 1000ML POUR (IV SOLUTION) ×3 IMPLANT

## 2016-06-13 NOTE — Progress Notes (Signed)
Pt admitted to the unit from pacu; pt A&O x4; MAE x4; denies any numbness or tingling to BUE and BLE. Pt oriented to the unit and room; foley intact and unclamped; back incision dsg honeycomb dsg remains clean, dry and intact with no drainage or active bleeding noted. Skin dry and intact with no pressure ulcers or wounds except for surgical incision dsg. Fall and safety precaution prevention education completed. Bed alarm on; IV intact and transfusing; SCD's on; VSS. Pt resting in bed with call light within reach. Will closely monitor and report off to oncoming RN. Dionne BucyP. Amo Meshach Perry RN

## 2016-06-13 NOTE — H&P (Signed)
Subjective: Patient is a 36 y.o. male admitted for back pain. Onset of symptoms was several months ago, gradually worsening since that time.  The pain is rated severe, and is located at the across the lower back and radiates to legs. The pain is described as aching and occurs all day. The symptoms have been progressive. Symptoms are exacerbated by exercise. MRI or CT showed pseudoarthrosis L4-S1   Past Medical History:  Diagnosis Date  . Back pain   . Difficulty swallowing    Pt stated "I have difficulty swallowing my spit when I lay down at night"  . Family history of adverse reaction to anesthesia    Patients grandmother had a difficult time waking up; patients mother was given to much anesthesia during knee surgery on 09/28/14  . History of kidney stones   . PONV (postoperative nausea and vomiting)     Past Surgical History:  Procedure Laterality Date  . ABDOMINAL EXPOSURE N/A 08/23/2015   Procedure: ABDOMINAL EXPOSURE;  Surgeon: Larina Earthly, MD;  Location: [facility];  Service: Vascular;  Laterality: N/A;  . ANTERIOR LUMBAR FUSION N/A 08/23/2015   Procedure: Lumbar four-five, Lumbar five-Sacral one ANTERIOR LUMBAR FUSION with anterior plating ;  Surgeon: Tia Alert, MD;  Location: [facility];  Service: Neurosurgery;  Laterality: N/A;  . arm surgery Left    Open treatment internal fixation left forearm radius plate  . BACK SURGERY    . WOUND EXPLORATION Left 10/06/2015   Procedure: WOUND EXPLORATION;  Surgeon: Chuck Hint, MD;  Location: [facility];  Service: Vascular;  Laterality: Left;    Prior to Admission medications   Medication Sig Start Date End Date Taking? Authorizing Provider  oxyCODONE-acetaminophen (PERCOCET) 10-325 MG tablet Take 1-2 tablets by mouth every 4 (four) hours as needed. Patient taking differently: Take 1-2 tablets by mouth every 4 (four) hours as needed. For pain. 10/07/15  Yes Loura Halt Ditty, MD   Allergies  Allergen Reactions  . Codeine  Hives    Social History  Substance Use Topics  . Smoking status: Former Smoker    Packs/day: 1.00    Years: 26.00    Types: Cigarettes  . Smokeless tobacco: Former Neurosurgeon  . Alcohol use No    Family History  Problem Relation Age of Onset  . Hypertension Mother   . Cancer Other   . Diabetes Other      Review of Systems  Positive ROS: neg  All other systems have been reviewed and were otherwise negative with the exception of those mentioned in the HPI and as above.  Objective: Vital signs in last 24 hours: Temp:  [98.1 F (36.7 C)] 98.1 F (36.7 C) (03/01 1115) Pulse Rate:  [69] 69 (03/01 1115) Resp:  [20] 20 (03/01 1115) BP: (145)/(76) 145/76 (03/01 1115) SpO2:  [98 %] 98 % (03/01 1115) Weight:  [105.7 kg (233 lb)] 105.7 kg (233 lb) (03/01 1115)  General Appearance: Alert, cooperative, no distress, appears stated age Head: Normocephalic, without obvious abnormality, atraumatic Eyes: PERRL, conjunctiva/corneas clear, EOM's intact    Neck: Supple, symmetrical, trachea midline Back: Symmetric, no curvature, ROM normal, no CVA tenderness Lungs:  respirations unlabored Heart: Regular rate and rhythm Abdomen: Soft, non-tender Extremities: Extremities normal, atraumatic, no cyanosis or edema Pulses: 2+ and symmetric all extremities Skin: Skin color, texture, turgor normal, no rashes or lesions  NEUROLOGIC:   Mental status: Alert and oriented x4,  no aphasia, good attention span, fund of knowledge, and memory Motor  Exam - grossly normal Sensory Exam - grossly normal Reflexes: 1= Coordination - grossly normal Gait - grossly normal Balance - grossly normal Cranial Nerves: I: smell Not tested  II: visual acuity  OS: nl    OD: nl  II: visual fields Full to confrontation  II: pupils Equal, round, reactive to light  III,VII: ptosis None  III,IV,VI: extraocular muscles  Full ROM  V: mastication Normal  V: facial light touch sensation  Normal  V,VII: corneal reflex   Present  VII: facial muscle function - upper  Normal  VII: facial muscle function - lower Normal  VIII: hearing Not tested  IX: soft palate elevation  Normal  IX,X: gag reflex Present  XI: trapezius strength  5/5  XI: sternocleidomastoid strength 5/5  XI: neck flexion strength  5/5  XII: tongue strength  Normal    Data Review Lab Results  Component Value Date   WBC 11.3 (H) 06/05/2016   HGB 14.4 06/05/2016   HCT 42.1 06/05/2016   MCV 87.2 06/05/2016   PLT 215 06/05/2016   Lab Results  Component Value Date   NA 138 06/05/2016   K 4.2 06/05/2016   CL 107 06/05/2016   CO2 24 06/05/2016   BUN 7 06/05/2016   CREATININE 0.98 06/05/2016   GLUCOSE 97 06/05/2016   Lab Results  Component Value Date   INR 1.00 06/05/2016    Assessment/Plan: Patient admitted for posterior lumbar instrumented fusion L4-S1. Patient has failed a reasonable attempt at conservative therapy.  I explained the condition and procedure to the patient and answered any questions.  Patient wishes to proceed with procedure as planned. Understands risks/ benefits and typical outcomes of procedure.   Manuel Allen S 06/13/2016 12:50 PM

## 2016-06-13 NOTE — Anesthesia Preprocedure Evaluation (Addendum)
Anesthesia Evaluation  Patient identified by MRN, date of birth, ID band Patient awake    History of Anesthesia Complications (+) PONV and history of anesthetic complications  Airway Mallampati: I  TM Distance: <3 FB Neck ROM: Limited    Dental  (+) Teeth Intact, Dental Advisory Given   Pulmonary former smoker,    breath sounds clear to auscultation       Cardiovascular negative cardio ROS   Rhythm:Regular Rate:Normal     Neuro/Psych negative neurological ROS  negative psych ROS   GI/Hepatic negative GI ROS, Neg liver ROS,   Endo/Other  negative endocrine ROS  Renal/GU negative Renal ROS  negative genitourinary   Musculoskeletal  (+) Arthritis ,   Abdominal   Peds negative pediatric ROS (+)  Hematology negative hematology ROS (+)   Anesthesia Other Findings   Reproductive/Obstetrics negative OB ROS                            Lab Results  Component Value Date   WBC 11.3 (H) 06/05/2016   HGB 14.4 06/05/2016   HCT 42.1 06/05/2016   MCV 87.2 06/05/2016   PLT 215 06/05/2016   Lab Results  Component Value Date   CREATININE 0.98 06/05/2016   BUN 7 06/05/2016   NA 138 06/05/2016   K 4.2 06/05/2016   CL 107 06/05/2016   CO2 24 06/05/2016   Lab Results  Component Value Date   INR 1.00 06/05/2016   INR 1.00 08/14/2015   EKG: normal EKG, normal sinus rhythm.  Anesthesia Physical Anesthesia Plan  ASA: II  Anesthesia Plan: General   Post-op Pain Management:    Induction: Intravenous  Airway Management Planned: Oral ETT  Additional Equipment:   Intra-op Plan:   Post-operative Plan: Extubation in OR  Informed Consent: I have reviewed the patients History and Physical, chart, labs and discussed the procedure including the risks, benefits and alternatives for the proposed anesthesia with the patient or authorized representative who has indicated his/her understanding and  acceptance.   Dental advisory given  Plan Discussed with: CRNA  Anesthesia Plan Comments:         Anesthesia Quick Evaluation

## 2016-06-13 NOTE — Anesthesia Procedure Notes (Signed)
Procedure Name: Intubation Date/Time: 06/13/2016 1:08 PM Performed by: Clearnce Sorrel Pre-anesthesia Checklist: Patient identified, Emergency Drugs available, Suction available, Patient being monitored and Timeout performed Patient Re-evaluated:Patient Re-evaluated prior to inductionOxygen Delivery Method: Circle system utilized Preoxygenation: Pre-oxygenation with 100% oxygen Intubation Type: IV induction Ventilation: Mask ventilation without difficulty Laryngoscope Size: Mac and 4 Grade View: Grade II Tube type: Oral Tube size: 7.5 mm Number of attempts: 1 Airway Equipment and Method: Stylet Placement Confirmation: ETT inserted through vocal cords under direct vision,  positive ETCO2 and breath sounds checked- equal and bilateral Secured at: 22 cm Tube secured with: Tape Dental Injury: Teeth and Oropharynx as per pre-operative assessment

## 2016-06-13 NOTE — Transfer of Care (Signed)
Immediate Anesthesia Transfer of Care Note  Patient: Dorthula MatasBrian L Heuberger  Procedure(s) Performed: Procedure(s): Posterior Lateral Fusion - Lumbar four-five,  Lumbar five-sacrum one  segmental instrumentation  Lumbar four-sacrum one (N/A)  Patient Location: PACU  Anesthesia Type:General  Level of Consciousness: awake, alert  and oriented  Airway & Oxygen Therapy: Patient Spontanous Breathing and Patient connected to face mask oxygen  Post-op Assessment: Report given to RN and Post -op Vital signs reviewed and stable  Post vital signs: Reviewed and stable  Last Vitals:  Vitals:   06/13/16 1115 06/13/16 1710  BP: (!) 145/76   Pulse: 69   Resp: 20   Temp: 36.7 C 36.6 C    Last Pain:  Vitals:   06/13/16 1710  TempSrc:   PainSc: Asleep         Complications: No apparent anesthesia complications

## 2016-06-13 NOTE — Op Note (Signed)
06/13/2016  5:02 PM  PATIENT:  Manuel Allen  36 y.o. 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 Posterior fixation L 4 to S1 inclusive using cortical pedicle screws.  2. Intertransverse arthrodesis L4-S1 bilaterally using morcellized allograft with bone marrow aspirate obtained through a separate fascial incision over the right iliac crest.  SURGEON:  Marikay Alar, MD  ASSISTANTS: Dr. Venetia Maxon  ANESTHESIA:  General  EBL: 200 ml  Total I/O In: 1000 [I.V.:1000] Out: 900 [Urine:700; Blood:200]  BLOOD ADMINISTERED:none  DRAINS: none   INDICATION FOR PROCEDURE: This patient presented with back and leg pain. Imaging revealed pseudoarthrosis L4-5 L5-S1. The patient tried a reasonable attempt at conservative medical measures without relief. I recommended instrumented fusion to address this. Patient understood the risks, benefits, and alternatives and potential outcomes and wished to proceed.  PROCEDURE DETAILS:  The patient was brought to the operating room. After induction of generalized endotracheal anesthesia the patient was rolled into the prone position on chest rolls and all pressure points were padded. The patient's lumbar region was cleaned and then prepped with DuraPrep and draped in the usual sterile fashion. Anesthesia was injected and then a dorsal midline incision was made and carried down to the lumbosacral fascia. The fascia was opened and the paraspinous musculature was taken down in a subperiosteal fashion to expose L4-5 and L5-S1. A self-retaining retractor was placed. Intraoperative fluoroscopy confirmed my level, and I started with placement of the L4 and L5 cortical pedicle screws. The pedicle screw entry zones were identified utilizing surface landmarks and  AP and lateral fluoroscopy. I scored the cortex with the high-speed drill and then used the hand drill and EMG monitoring to  drill an upward and outward direction into the pedicle. I then tapped line to line, and the tap was also monitored. I then placed a high 0.5 x 40 mm cortical pedicle screw into the pedicles of L4 and L5 bilaterally. I dissected in a suprafascial plane to find the right iliac crest. The fascia was opened and a MCW needle was used to extract about 20-30 mL of bone marrow aspirate and this was soaked on cortical cancellus chips. I then turned my attention to the posterior lateral fusion. I exposed the transverse processes of L4-L5 and the sacral a lot. I decorticated these with a high-speed drill. I packed a mixture of cortical cancellus chips that were soaked with bone marrow aspirate obtained from the separate fascial incision. These were packed out over the transverse processes and sacral alar bilaterally. We then turned our attention to the placement of the lower S1 pedicle screws. The pedicle screw entry zones were identified utilizing surface landmarks and fluoroscopy. I drilled into each pedicle utilizing the hand drill and EMG monitoring, and tapped each pedicle with the appropriate tap. We palpated with a ball probe to assure no break in the cortex. We then placed 6.5 x 40 mm pedicle screws into the pedicles bilaterally at S1. . We then placed lordotic rods into the multiaxial screw heads of the pedicle screws and locked these in position with the locking caps and anti-torque device. We then checked our construct with AP and lateral fluoroscopy. Irrigated with copious amounts of bacitracin-containing saline solution. We closed the muscle and the fascia with 0 Vicryl. Closed the subcutaneous tissues with 2-0 Vicryl and subcuticular tissues with 3-0 Vicryl. The skin was closed with benzoin and Steri-Strips. Dressing  was then applied, the patient was awakened from general anesthesia and transported to the recovery room in stable condition. At the end of the procedure all sponge, needle and instrument counts were  correct.   PLAN OF CARE: admit to inpatient  PATIENT DISPOSITION:  PACU - hemodynamically stable.   Delay start of Pharmacological VTE agent (>24hrs) due to surgical blood loss or risk of bleeding:  yes

## 2016-06-14 ENCOUNTER — Encounter (HOSPITAL_COMMUNITY): Payer: Self-pay | Admitting: Neurological Surgery

## 2016-06-14 MED FILL — Heparin Sodium (Porcine) Inj 1000 Unit/ML: INTRAMUSCULAR | Qty: 30 | Status: AC

## 2016-06-14 MED FILL — Sodium Chloride IV Soln 0.9%: INTRAVENOUS | Qty: 1000 | Status: AC

## 2016-06-14 NOTE — Discharge Summary (Signed)
Physician Discharge Summary  Patient ID: Manuel Allen MRN: [id number] DOB/AGE: 07-14-1980 36 y.o.  Admit date: 06/13/2016 Discharge date: 06/14/2016  Admission Diagnoses: Posterior lumbar fusion    Discharge Diagnoses: posterior lumbar fusion   Discharged Condition: good  Hospital Course: The patient was admitted on 06/13/2016 and taken to the operating room where the patient underwent a posterior lumbar fusion. The patient tolerated the procedure well and was taken to the recovery room and then to the floor in stable condition. The hospital course was routine. There were no complications. The wound remained clean dry and intact. Pt had appropriate back soreness. No complaints of new N/T/W. The patient remained afebrile with stable vital signs, and tolerated a regular diet. The patient continued to increase activities, and pain was well controlled with oral pain medications.   Consults: None  Significant Diagnostic Studies:  Results for orders placed or performed during the hospital encounter of 06/05/16  Surgical pcr screen  Result Value Ref Range   MRSA, PCR NEGATIVE NEGATIVE   Staphylococcus aureus POSITIVE (A) NEGATIVE  Basic metabolic panel  Result Value Ref Range   Sodium 138 135 - 145 mmol/L   Potassium 4.2 3.5 - 5.1 mmol/L   Chloride 107 101 - 111 mmol/L   CO2 24 22 - 32 mmol/L   Glucose, Bld 97 65 - 99 mg/dL   BUN 7 6 - 20 mg/dL   Creatinine, Ser 5.78 0.61 - 1.24 mg/dL   Calcium 9.4 8.9 - 46.9 mg/dL   GFR calc non Af Amer >60 >60 mL/min   GFR calc Af Amer >60 >60 mL/min   Anion gap 7 5 - 15  CBC  Result Value Ref Range   WBC 11.3 (H) 4.0 - 10.5 K/uL   RBC 4.83 4.22 - 5.81 MIL/uL   Hemoglobin 14.4 13.0 - 17.0 g/dL   HCT 62.9 52.8 - 41.3 %   MCV 87.2 78.0 - 100.0 fL   MCH 29.8 26.0 - 34.0 pg   MCHC 34.2 30.0 - 36.0 g/dL   RDW 24.4 01.0 - 27.2 %   Platelets 215 150 - 400 K/uL  Protime-INR  Result Value Ref Range   Prothrombin Time 13.2 11.4 - 15.2 seconds   INR  1.00   Type and screen [facility]  Result Value Ref Range   ABO/RH(D) O POS    Antibody Screen NEG    Sample Expiration 06/19/2016    Extend sample reason NO TRANSFUSIONS OR PREGNANCY IN THE PAST 3 MONTHS     Dg Lumbar Spine 2-3 Views  Result Date: 06/13/2016 CLINICAL DATA:  Lumbar fusion. EXAM: LUMBAR SPINE - 2-3 VIEW; DG C-ARM 61-120 MIN COMPARISON:  Lumbar spine CT 02/14/2016 FLUOROSCOPY TIME:  C-arm fluoroscopic images were obtained intraoperatively and submitted for post operative interpretation. Please see the performing provider's procedural report for the fluoroscopy time utilized. FINDINGS: Two intraoperative spot fluoroscopic images of the lower lumbar spine are provided in the frontal and lateral projections. Pre-existing anterior fusion is again noted at L4-5 and L5-S1. There has been interval posterior fusion from L4-S1 with placement of bilateral pedicle screws and interconnecting rods. IMPRESSION: Intraoperative images during L4-S1 posterior fusion. Electronically Signed   By: Sebastian Ache M.D.   On: 06/13/2016 16:49   Dg C-arm 61-120 Min  Result Date: 06/13/2016 CLINICAL DATA:  Lumbar fusion. EXAM: LUMBAR SPINE - 2-3 VIEW; DG C-ARM 61-120 MIN COMPARISON:  Lumbar spine CT 02/14/2016 FLUOROSCOPY TIME:  C-arm fluoroscopic images were obtained intraoperatively and  submitted for post operative interpretation. Please see the performing provider's procedural report for the fluoroscopy time utilized. FINDINGS: Two intraoperative spot fluoroscopic images of the lower lumbar spine are provided in the frontal and lateral projections. Pre-existing anterior fusion is again noted at L4-5 and L5-S1. There has been interval posterior fusion from L4-S1 with placement of bilateral pedicle screws and interconnecting rods. IMPRESSION: Intraoperative images during L4-S1 posterior fusion. Electronically Signed   By: Sebastian AcheAllen  Grady M.D.   On: 06/13/2016 16:49    Antibiotics:   Anti-infectives    Start     Dose/Rate Route Frequency Ordered Stop   06/13/16 2100  ceFAZolin (ANCEF) IVPB 2g/100 mL premix     2 g 200 mL/hr over 30 Minutes Intravenous Every 8 hours 06/13/16 1837 06/14/16 0430   06/13/16 1539  vancomycin (VANCOCIN) powder  Status:  Discontinued       As needed 06/13/16 1539 06/13/16 1702   06/13/16 1405  50,000 units bacitracin in 0.9% normal saline 250 mL irrigation  Status:  Discontinued       As needed 06/13/16 1406 06/13/16 1702   06/13/16 1200  ceFAZolin (ANCEF) IVPB 2g/100 mL premix     2 g 200 mL/hr over 30 Minutes Intravenous On call to O.R. 06/13/16 1108 06/13/16 1315   06/13/16 1110  ceFAZolin (ANCEF) 2-4 GM/100ML-% IVPB    Comments:  Lonia ChimeraMoore, Kristi   : cabinet override      06/13/16 1110 06/13/16 1315      Discharge Exam: Blood pressure 138/71, pulse 89, temperature 97.9 F (36.6 C), temperature source Oral, resp. rate 16, height 6' (1.829 m), weight 105.7 kg (233 lb 0.4 oz), SpO2 96 %. Neurologic: Grossly normal Incision CDI  Discharge Medications:   Allergies as of 06/14/2016      Reactions   Codeine Hives      Medication List    STOP taking these medications   oxyCODONE-acetaminophen 10-325 MG tablet Commonly known as:  Water quality scientistERCOCET            Durable Medical Equipment        Start     Ordered   06/13/16 1838  DME Walker rolling  Once    Question:  Patient needs a walker to treat with the following condition  Answer:  S/P lumbar fusion   06/13/16 1837   06/13/16 1838  DME 3 n 1  Once     06/13/16 1837      Disposition:Home   Final Dx: posterior lumbar fusion  Discharge Instructions    Call MD for:  difficulty breathing, headache or visual disturbances    Complete by:  As directed    Call MD for:  persistant nausea and vomiting    Complete by:  As directed    Call MD for:  redness, tenderness, or signs of infection (pain, swelling, redness, odor or green/yellow discharge around incision site)    Complete by:   As directed    Call MD for:  severe uncontrolled pain    Complete by:  As directed    Call MD for:  temperature >100.4    Complete by:  As directed    Diet - low sodium heart healthy    Complete by:  As directed    Increase activity slowly    Complete by:  As directed    Lifting restrictions    Complete by:  As directed    No more than 10lbs   Remove dressing in 24 hours  Complete by:  As directed       Follow-up Information    Shyheem Whitham S, MD. Schedule an appointment as soon as possible for a visit in 2 week(s).   Specialty:  Neurosurgery Contact information: [address] [place] [address] [phone]            Signed: Tia Alert 06/14/2016, 9:17 AM

## 2016-06-14 NOTE — Evaluation (Signed)
Occupational Therapy Evaluation and Discharge Patient Details Name: Manuel MatasBrian L Whittley MRN: [id number] DOB: [birth year] Today's Date: 06/14/2016    History of Present Illness s/p PLIF L4-S1.   Clinical Impression   Pt is functioning at a modified independent level in ADL and mobility. Educated pt in back precautions with pt and mom verbalizing understanding, reinforced with written handout. No further OT needs.    Follow Up Recommendations  No OT follow up    Equipment Recommendations  None recommended by OT    Recommendations for Other Services       Precautions / Restrictions Precautions Precautions: Back Precaution Booklet Issued: Yes (comment) Precaution Comments: reviewed back precautions related to ADL, IADL and mobility Restrictions Weight Bearing Restrictions: No      Mobility Bed Mobility               General bed mobility comments: instructed in log roll technique  Transfers Overall transfer level: Independent Equipment used: None                  Balance                                            ADL Overall ADL's : Modified independent                                       General ADL Comments: Instructed in compensatory strategies for ADL and IADL to maintain back precautions.     Vision Baseline Vision/History: No visual deficits       Perception     Praxis      Pertinent Vitals/Pain Pain Assessment: 0-10 Pain Score: 2  Pain Location: back Pain Descriptors / Indicators: Sore Pain Intervention(s): Monitored during session;Premedicated before session;Repositioned     Hand Dominance Right   Extremity/Trunk Assessment Upper Extremity Assessment Upper Extremity Assessment: Overall WFL for tasks assessed   Lower Extremity Assessment Lower Extremity Assessment: Defer to PT evaluation       Communication Communication Communication: No difficulties   Cognition Arousal/Alertness:  Awake/alert Behavior During Therapy: WFL for tasks assessed/performed Overall Cognitive Status: Within Functional Limits for tasks assessed                     General Comments       Exercises       Shoulder Instructions      Home Living Family/patient expects to be discharged to:: Private residence Living Arrangements: Parent (mom) Available Help at Discharge: Family;Available 24 hours/day Type of Home: House Home Access: Stairs to enter Entergy CorporationEntrance Stairs-Number of Steps: 1   Home Layout: One level     Bathroom Shower/Tub: Producer, television/film/videoWalk-in shower   Bathroom Toilet: Standard     Home Equipment: Cane - single point;Bedside commode          Prior Functioning/Environment Level of Independence: Independent        Comments: does not work        OT Problem List:        OT Treatment/Interventions:      OT Goals(Current goals can be found in the care plan section) Acute Rehab OT Goals Patient Stated Goal: to go home  OT Frequency:     Barriers to D/C:  Co-evaluation              End of Session Nurse Communication: Mobility status (aware pt is on 2nd floor with mom)  Activity Tolerance: Patient tolerated treatment well Patient left:  (with mother in lobby, RN aware)  OT Visit Diagnosis: Pain Pain - part of body:  (back)                ADL either performed or assessed with clinical judgement  Time: 1610-9604 OT Time Calculation (min): 15 min Charges:  OT General Charges $OT Visit: 1 Procedure OT Evaluation $OT Eval Low Complexity: 1 Procedure G-Codes:      Evern Bio 06/14/2016, 8:57 AM  [phone]

## 2016-06-14 NOTE — Progress Notes (Signed)
Patient ambulated 100 ft  this morning with walker. Patient in no distress. Pain controlled.  Foley removed. Will continue to monitor.

## 2016-06-14 NOTE — Care Management Note (Signed)
Case Management Note  Patient Details  Name: Manuel Allen MRN: [id number] Date of Birth: Sep 16, 1980  Subjective/Objective:                    Action/Plan: Pt discharged home with self care. No f/u per PT/OT. Pt did have orders for DME. Pt stated he had all the equipment at home and did not need any more. Pt with insurance and PCP and transportation home.   Expected Discharge Date:  06/14/16               Expected Discharge Plan:  Home/Self Care  In-House Referral:     Discharge planning Services  CM Consult  Post Acute Care Choice:  Durable Medical Equipment (Pt refused) Choice offered to:     DME Arranged:    DME Agency:     HH Arranged:    HH Agency:     Status of Service:  Completed, signed off  If discussed at [employer] Length of [employer] Meetings, dates discussed:    Additional Comments:  Kermit BaloKelli F Madeline Pho, RN 06/14/2016, 12:01 PM

## 2016-06-14 NOTE — Evaluation (Signed)
Physical Therapy Evaluation Patient Details Name: Manuel Allen MRN: [id number] DOB: Dec 13, 1980 Today's Date: 06/14/2016   History of Present Illness  s/p PLIF L4-S1.  Clinical Impression  Patient functioning at independent to supervision level with all mobility and gait.  Back precautions reviewed.  Patient able to negotiate stairs with supervision.  No further PT needs identified - PT will sign off.    Follow Up Recommendations No PT follow up;Supervision - Intermittent    Equipment Recommendations  None recommended by PT    Recommendations for Other Services       Precautions / Restrictions Precautions Precautions: Back Precaution Booklet Issued: Yes (comment) Precaution Comments: Patient able to recall 2/3 back precautions.  Reviewed with patient and his mother. Restrictions Weight Bearing Restrictions: No      Mobility  Bed Mobility Overal bed mobility: Modified Independent             General bed mobility comments: Reviewed log rolling technique.  Increased time.  Transfers Overall transfer level: Independent Equipment used: None             General transfer comment: Cues to stand for several seconds before gait.  Ambulation/Gait Ambulation/Gait assistance: Independent Ambulation Distance (Feet): 200 Feet Assistive device: None Gait Pattern/deviations: Step-through pattern;Decreased stride length Gait velocity: decreased Gait velocity interpretation: Below normal speed for age/gender General Gait Details: Patient with good gait pattern and balance.  Decreased gait speed.  Reviewed avoiding twisting during gait.  Stairs Stairs: Yes Stairs assistance: Supervision Stair Management: One rail Right;Alternating pattern;Forwards Number of Stairs: 8 General stair comments: Assist for safety only.  Reviewed option of step-to technique.  Wheelchair Mobility    Modified Rankin (Stroke Patients Only)       Balance                                              Pertinent Vitals/Pain Pain Assessment: 0-10 Pain Score: 2  Pain Location: back Pain Descriptors / Indicators: Sore Pain Intervention(s): Monitored during session;Repositioned    Home Living Family/patient expects to be discharged to:: Private residence Living Arrangements: Parent (Mother) Available Help at Discharge: Family;Available 24 hours/day (Mother) Type of Home: House Home Access: Stairs to enter Entrance Stairs-Rails: None Entrance Stairs-Number of Steps: 1 Home Layout: One level Home Equipment: Cane - single point;Bedside commode      Prior Function Level of Independence: Independent         Comments: does not work     Higher education careers adviserHand Dominance   Dominant Hand: Right    Extremity/Trunk Assessment   Upper Extremity Assessment Upper Extremity Assessment: Defer to OT evaluation    Lower Extremity Assessment Lower Extremity Assessment: Generalized weakness       Communication   Communication: No difficulties  Cognition Arousal/Alertness: Awake/alert Behavior During Therapy: WFL for tasks assessed/performed Overall Cognitive Status: Within Functional Limits for tasks assessed                      General Comments      Exercises     Assessment/Plan    PT Assessment Patent does not need any further PT services  PT Problem List         PT Treatment Interventions      PT Goals (Current goals can be found in the Care Plan section)  Acute Rehab PT Goals Patient Stated Goal:  to go home PT Goal Formulation: All assessment and education complete, DC therapy    Frequency     Barriers to discharge        Co-evaluation               End of Session   Activity Tolerance: Patient tolerated treatment well Patient left: in bed;with call bell/phone within reach;with family/visitor present Nurse Communication: Mobility status (No PT needs) PT Visit Diagnosis: Muscle weakness (generalized) (M62.81);Pain Pain - part  of body:  (back)         Time: 2956-2130 PT Time Calculation (min) (ACUTE ONLY): 9 min   Charges:   PT Evaluation $PT Eval Low Complexity: 1 Procedure     PT G Codes:         Vena Austria 02-Jul-2016, 10:18 AM Durenda Hurt. Renaldo Fiddler, [facility] Acute Rehab Services Pager [phone]

## 2016-06-14 NOTE — Anesthesia Postprocedure Evaluation (Signed)
Anesthesia Post Note  Patient: Manuel Allen  Procedure(s) Performed: Procedure(s) (LRB): Posterior Lateral Fusion - Lumbar four-five,  Lumbar five-sacrum one  segmental instrumentation  Lumbar four-sacrum one (N/A)  Patient location during evaluation: PACU Anesthesia Type: General Level of consciousness: awake and sedated Pain management: pain level controlled Vital Signs Assessment: post-procedure vital signs reviewed and stable Respiratory status: spontaneous breathing, nonlabored ventilation, respiratory function stable and patient connected to nasal cannula oxygen Cardiovascular status: blood pressure returned to baseline and stable Postop Assessment: no signs of nausea or vomiting Anesthetic complications: no       Last Vitals:  Vitals:   06/14/16 0157 06/14/16 0613  BP: (!) 111/59 138/71  Pulse: 80 89  Resp: 16 16  Temp: 36.8 C 36.6 C    Last Pain:  Vitals:   06/14/16 0631  TempSrc:   PainSc: 7                  Cyrah Mclamb,JAMES TERRILL

## 2016-06-14 NOTE — Progress Notes (Signed)
Pt discharge education and instructions completed with pt and family at bedside. All voices understanding and denies any questions. Pt IV removed; back incision remains clean, dry and intact with no stain or active bleeding noted. Pt discharge home with family to transport him home. Pt to pick up electronically sent prescription at preferred pharmacy on file per MD. Pt offered wheelchair but he refuses and ambulated off unit with family and belongings to the side. Dionne BucyP. Amo Williams Dietrick RN

## 2016-06-14 NOTE — Progress Notes (Signed)
NEUROSURGERY PROGRESS NOTE  Doing well. Complains of appropriate back soreness. Very mild back pain that is controlled with pain medication No numbness, tingling or weakness Ambulating and voiding well Good strength and sensation Incision CDI Afebrile overnight Eating and drinking well  Temp:  [97.8 F (36.6 C)-98.5 F (36.9 C)] 97.9 F (36.6 C) (03/02 16100613) Pulse Rate:  [62-89] 89 (03/02 0613) Resp:  [13-20] 16 (03/02 0613) BP: (111-145)/(59-80) 138/71 (03/02 0613) SpO2:  [95 %-100 %] 96 % (03/02 0613) Weight:  [105.7 kg (233 lb)-105.7 kg (233 lb 0.4 oz)] 105.7 kg (233 lb 0.4 oz) (03/02 0003)  Plan: Will continue to monitor, possible discharge to home later today.   Manuel MangesKimberly Hannah Dwanda Tufano, NP 06/14/2016 7:07 AM

## 2016-09-05 ENCOUNTER — Emergency Department (HOSPITAL_COMMUNITY): Payer: Medicare Other

## 2016-09-05 ENCOUNTER — Emergency Department (HOSPITAL_COMMUNITY)
Admission: EM | Admit: 2016-09-05 | Discharge: 2016-09-05 | Disposition: A | Discharge status: Home / Self Care | Payer: Medicare Other | Attending: Emergency Medicine | Admitting: Emergency Medicine

## 2016-09-05 ENCOUNTER — Encounter (HOSPITAL_COMMUNITY): Payer: Self-pay | Admitting: *Deleted

## 2016-09-05 DIAGNOSIS — Y999 Unspecified external cause status: Secondary | ICD-10-CM | POA: Diagnosis not present

## 2016-09-05 DIAGNOSIS — Y939 Activity, unspecified: Secondary | ICD-10-CM | POA: Diagnosis not present

## 2016-09-05 DIAGNOSIS — M5442 Lumbago with sciatica, left side: Secondary | ICD-10-CM | POA: Diagnosis not present

## 2016-09-05 DIAGNOSIS — Y929 Unspecified place or not applicable: Secondary | ICD-10-CM | POA: Insufficient documentation

## 2016-09-05 DIAGNOSIS — F17210 Nicotine dependence, cigarettes, uncomplicated: Secondary | ICD-10-CM | POA: Insufficient documentation

## 2016-09-05 DIAGNOSIS — W19XXXA Unspecified fall, initial encounter: Secondary | ICD-10-CM | POA: Insufficient documentation

## 2016-09-05 DIAGNOSIS — S3992XA Unspecified injury of lower back, initial encounter: Secondary | ICD-10-CM | POA: Diagnosis present

## 2016-09-05 MED ORDER — HYDROCODONE-ACETAMINOPHEN 5-325 MG PO TABS: ORAL_TABLET | ORAL | 0 refills | Status: DC

## 2016-09-05 MED ORDER — METHOCARBAMOL 500 MG PO TABS: 500.0000 mg | ORAL_TABLET | Freq: Three times a day (TID) | ORAL | 0 refills | Status: DC

## 2016-09-05 NOTE — ED Notes (Signed)
Patient transported to X-ray. Pt ambulated  

## 2016-09-05 NOTE — Discharge Instructions (Signed)
Applies packs on-and-off to your back. Avoid excessive bending and twisting movements. Call your neurosurgeon to arrange a follow-up appointment.

## 2016-09-05 NOTE — ED Triage Notes (Signed)
Pt comes in for a fall on Sunday. He is now having back pain. He had a back surgery 2 months ago. Denies hitting his head or any LOC.

## 2016-09-07 NOTE — ED Provider Notes (Signed)
[facility]-EMERGENCY DEPT Provider Note   CSN: [id number] Arrival date & time: 09/05/16  1006     History   Chief Complaint Chief Complaint  Patient presents with  . Back Pain    HPI Manuel MatasBrian L Allen is a 36 y.o. male.  HPI   Manuel Allen is a 36 y.o. male who presents to the Emergency Department complaining of worsening of his chronic low back pain for several days.  Patient is s/p lumbar fusion 2 months ago.  He states that he suffered a mechanical fall few days ago which increased his pain.  He describes a throbbing pain to his lower back and pain radiating into his left thigh.  Pain worse with walking or standing.  He denies numbness or weakness of the lower extremities, fever, abd pain, urine or bowel changes.    Past Medical History:  Diagnosis Date  . Chronic lower back pain   . Difficulty swallowing    Pt stated "I have difficulty swallowing my spit when I lay down at night"  . Family history of adverse reaction to anesthesia    Patients grandmother had a difficult time waking up; patients mother was given to much anesthesia during knee surgery on 09/28/14  . History of kidney stones   . PONV (postoperative nausea and vomiting)     Patient Active Problem List   Diagnosis Date Noted  . Wound, surgical, infected 10/05/2015  . S/P lumbar spinal fusion 08/23/2015  . 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 ABDOMINAL EXPOSURE N/A 08/23/2015   Procedure: ABDOMINAL EXPOSURE;  Surgeon: Larina Earthlyodd F Early, MD;  Location: [facility];  Service: Vascular;  Laterality: N/A;  . ANTERIOR LUMBAR FUSION N/A 08/23/2015   Procedure: Lumbar four-five, Lumbar five-Sacral one ANTERIOR LUMBAR FUSION with anterior plating ;  Surgeon: Tia Alertavid S Jones, MD;  Location: [facility];  Service: Neurosurgery;  Laterality: N/A;  . BACK SURGERY    . FRACTURE SURGERY    . LAMINECTOMY WITH POSTERIOR LATERAL ARTHRODESIS LEVEL 2 N/A  06/13/2016   Procedure: Posterior Lateral Fusion - Lumbar four-five,  Lumbar five-sacrum one  segmental instrumentation  Lumbar four-sacrum one;  Surgeon: Tia Alertavid S Jones, MD;  [facility];  Service: Neurosurgery;  Laterality: N/A;  . ORIF RADIAL FRACTURE Left 1990s   Open treatment internal fixation left forearm radius plate  . POSTERIOR LUMBAR FUSION  06/13/2016  . WOUND EXPLORATION Left 10/06/2015   Procedure: WOUND EXPLORATION;  Surgeon: Chuck Hinthristopher S Dickson, MD;  [facility];  Service: Vascular;  Laterality: Left;       Home Medications    Prior to Admission medications   Medication Sig Start Date End Date Taking? Authorizing Provider  HYDROcodone-acetaminophen (NORCO/VICODIN) 5-325 MG tablet Take one-two tabs po q 4-6 hrs prn pain 09/05/16   Meryn Sarracino, PA-C  methocarbamol (ROBAXIN) 500 MG tablet Take 1 tablet (500 mg total) by mouth 3 (three) times daily. 09/05/16   Pauline Ausriplett, Toby Breithaupt, PA-C    Family History Family History  Problem Relation Age of Onset  . Hypertension Mother   . Cancer Other   . Diabetes Other     Social History Social History  Substance Use Topics  . Smoking status: Current Every Day Smoker    Packs/day: 1.00    Years: 26.00    Types: Cigarettes  . Smokeless tobacco: Former NeurosurgeonUser    Types: Chew     Comment: 06/13/2016 "quit chewing ~  2014"  . Alcohol use No     Allergies   Codeine   Review of Systems Review of Systems  Constitutional: Negative for fever.  Respiratory: Negative for shortness of breath.   Gastrointestinal: Negative for abdominal pain, constipation and vomiting.  Genitourinary: Negative for decreased urine volume, difficulty urinating, dysuria, flank pain and hematuria.  Musculoskeletal: Positive for back pain. Negative for joint swelling.  Skin: Negative for rash.  Neurological: Negative for weakness and numbness.  All other systems reviewed and are negative.    Physical Exam Updated Vital Signs BP 135/89 (BP  Location: Left Arm)   Pulse 61   Temp 97.9 F (36.6 C) (Oral)   Resp 18   Ht 6' (1.829 m)   Wt 104.3 kg (230 lb)   SpO2 100%   BMI 31.19 kg/m   Physical Exam  Constitutional: He is oriented to person, place, and time. He appears well-developed and well-nourished. No distress.  HENT:  Head: Normocephalic and atraumatic.  Neck: Normal range of motion. Neck supple.  Cardiovascular: Normal rate, regular rhythm, normal heart sounds and intact distal pulses.   No murmur heard. Pulmonary/Chest: Effort normal and breath sounds normal. No respiratory distress.  Abdominal: Soft. He exhibits no distension. There is no tenderness.  Musculoskeletal: He exhibits tenderness. He exhibits no edema.       Lumbar back: He exhibits tenderness and pain. He exhibits normal range of motion, no swelling, no deformity, no laceration and normal pulse.  ttp of the lower  lumbar spine and left lumbar paraspinal muscles.  Pt has 5/5 strength against resistance of bilateral lower extremities.     Neurological: He is alert and oriented to person, place, and time. He has normal strength. No sensory deficit. He exhibits normal muscle tone. Coordination and gait normal.  Reflex Scores:      Patellar reflexes are 2+ on the right side and 2+ on the left side.      Achilles reflexes are 2+ on the right side and 2+ on the left side. Skin: Skin is warm and dry. No rash noted.  Nursing note and vitals reviewed.    ED Treatments / Results  Labs (all labs ordered are listed, but only abnormal results are displayed) Labs Reviewed - No data to display  EKG  EKG Interpretation None       Radiology No results found.  Procedures Procedures (including critical care time)  Medications Ordered in ED Medications - No data to display   Initial Impression / Assessment and Plan / ED Course  I have reviewed the triage vital signs and the nursing notes.  Pertinent labs & imaging results that were available during  my care of the patient were reviewed by me and considered in my medical decision making (see chart for details).     Pt is non-toxic appearing.  vitals stable.  No focal neuro deficits.  Ambulates with a steady gait.  XR neg for fx or hardware changes.  Pt agrees to close f/u with his neurosurgeon.  Return precautions discussed.   Final Clinical Impressions(s) / ED Diagnoses   Final diagnoses:  Fall, initial encounter  Acute midline low back pain with left-sided sciatica    New Prescriptions Discharge Medication List as of 09/05/2016  1:15 PM    START taking these medications   Details  HYDROcodone-acetaminophen (NORCO/VICODIN) 5-325 MG tablet Take one-two tabs po q 4-6 hrs prn pain, Print    methocarbamol (ROBAXIN) 500 MG tablet Take 1 tablet (500 mg  total) by mouth 3 (three) times daily., Starting Thu 09/05/2016, Print         Mignon Bechler, [place], PA-C 09/07/16 2121    Mancel Bale, MD 09/10/16 [phone]

## 2016-09-13 NOTE — Anesthesia Postprocedure Evaluation (Signed)
Anesthesia Post Note  Patient: Manuel MatasBrian L Dohrman  Procedure(s) Performed: Procedure(s) (LRB): Posterior Lateral Fusion - Lumbar four-five,  Lumbar five-sacrum one  segmental instrumentation  Lumbar four-sacrum one (N/A)     Anesthesia Type: General    Last Vitals:  Vitals:   06/14/16 0613 06/14/16 0921  BP: 138/71 130/68  Pulse: 89 87  Resp: 16 18  Temp: 36.6 C 36.7 C    Last Pain:  Vitals:   06/14/16 1013  TempSrc:   PainSc: 4                  Cassandria Drew,JAMES TERRILL

## 2016-09-13 NOTE — Addendum Note (Signed)
Addendum  created 09/13/16 1218 by Llewelyn Sheaffer, MD   Sign clinical note    

## 2016-09-30 ENCOUNTER — Other Ambulatory Visit (HOSPITAL_COMMUNITY): Payer: Self-pay | Admitting: Neurological Surgery

## 2016-09-30 DIAGNOSIS — R1031 Right lower quadrant pain: Secondary | ICD-10-CM

## 2016-10-10 ENCOUNTER — Ambulatory Visit (HOSPITAL_COMMUNITY)
Admission: RE | Admit: 2016-10-10 | Discharge: 2016-10-10 | Disposition: A | Discharge status: Home / Self Care | Payer: Medicare Other | Source: Ambulatory Visit | Attending: Neurological Surgery | Admitting: Neurological Surgery

## 2016-10-10 DIAGNOSIS — R1031 Right lower quadrant pain: Secondary | ICD-10-CM | POA: Diagnosis not present

## 2016-10-10 DIAGNOSIS — M4327 Fusion of spine, lumbosacral region: Secondary | ICD-10-CM | POA: Insufficient documentation

## 2016-12-18 IMAGING — DX DG KNEE COMPLETE 4+V*L*
4 series · 4 of 4 positions shown · non-contrast
Comparison: None.

CLINICAL DATA: Pain around patellar area. Stepped in hole
yesterday.

EXAM:
LEFT KNEE - COMPLETE 4+ VIEW

[knee ap]
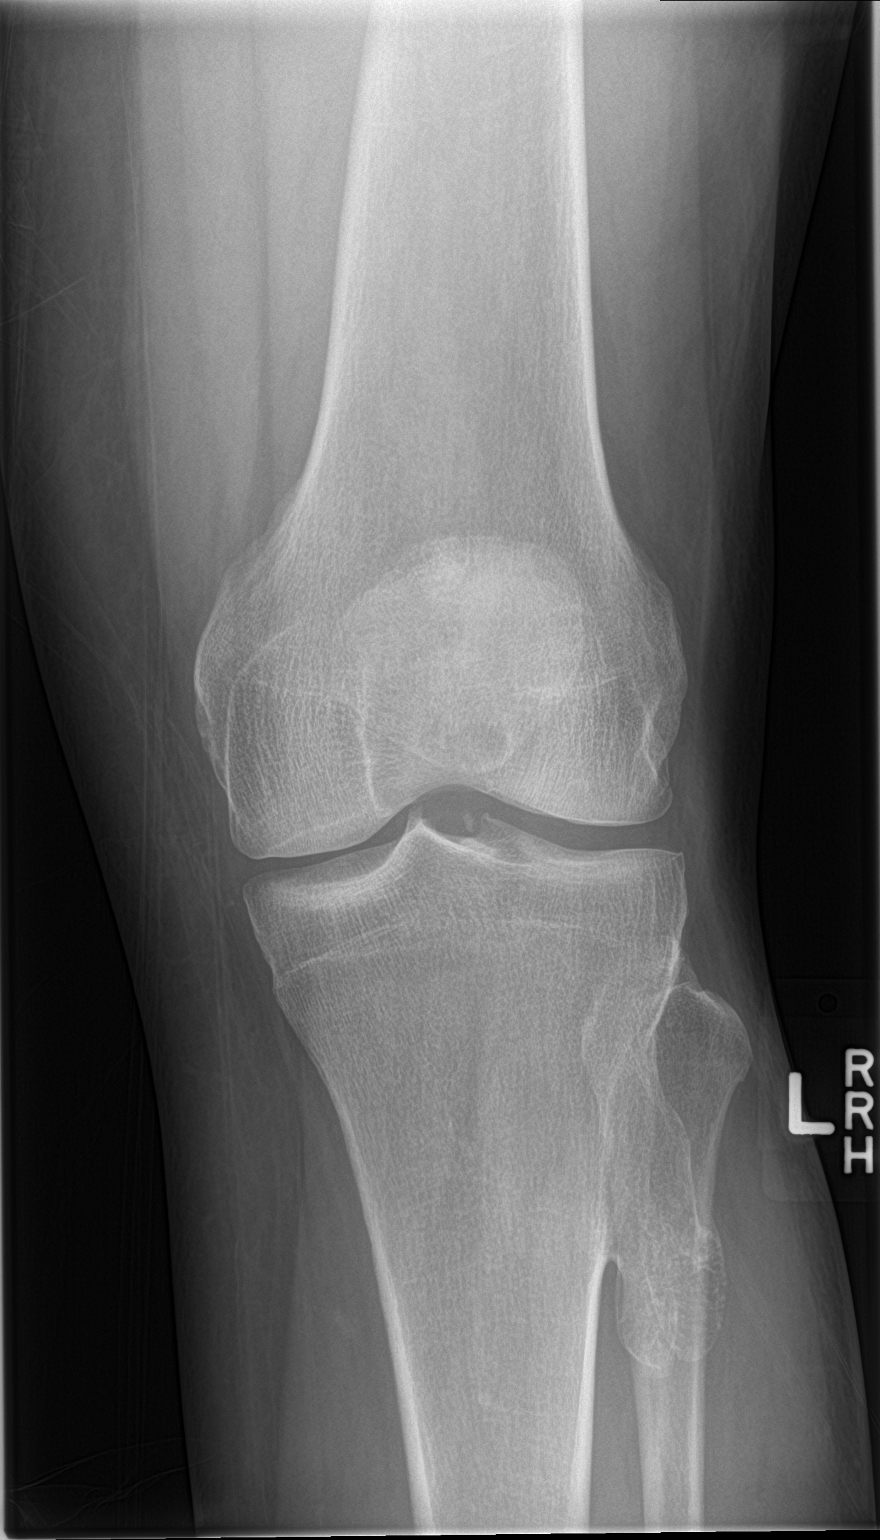

[knee obl (1 of 2)]
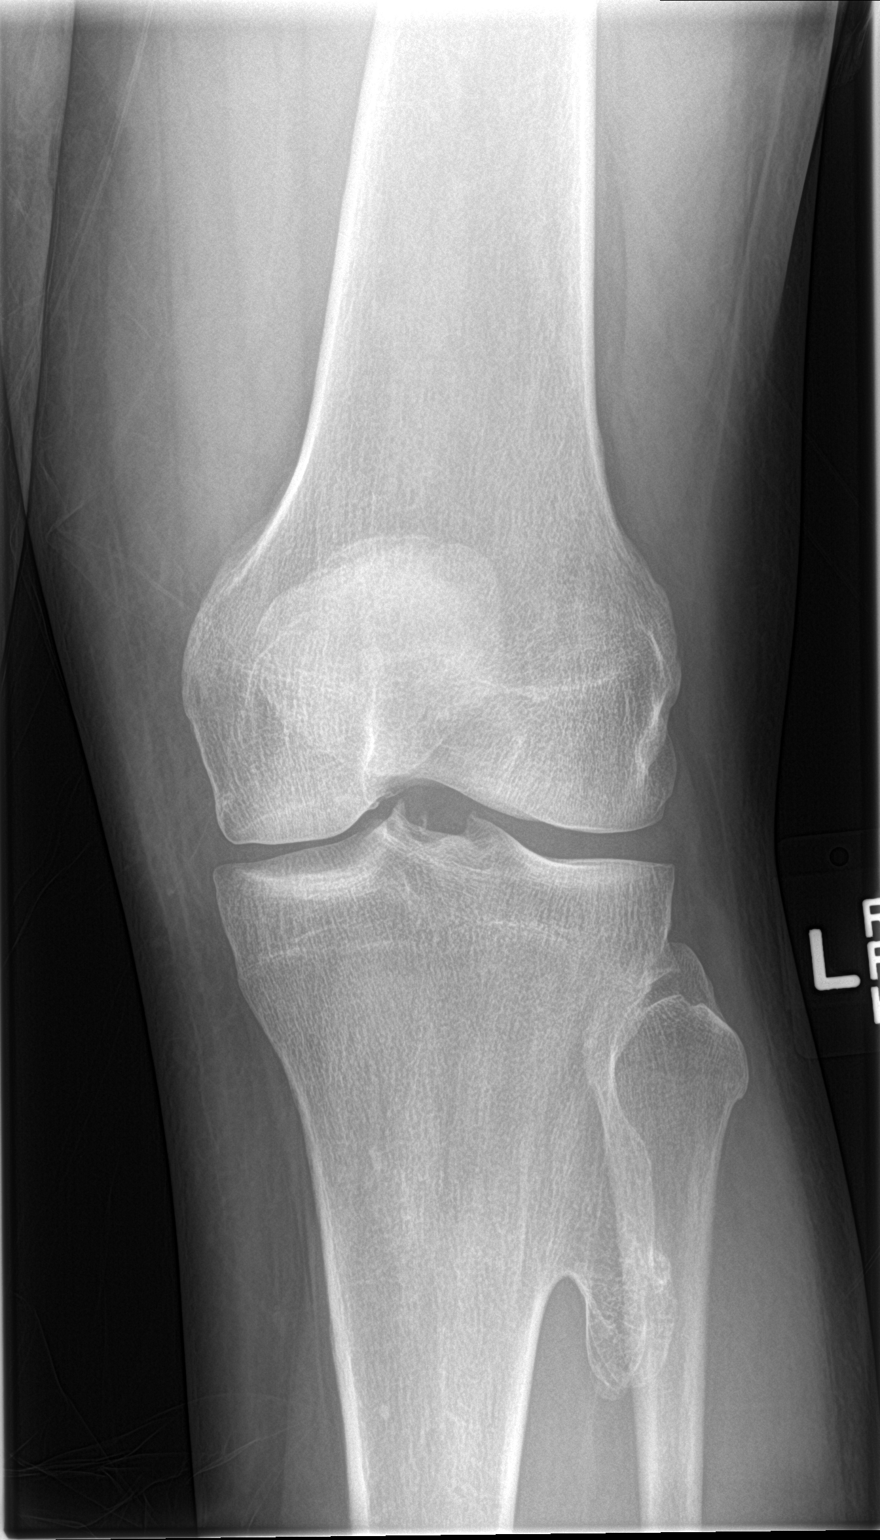

[knee obl (2 of 2)]
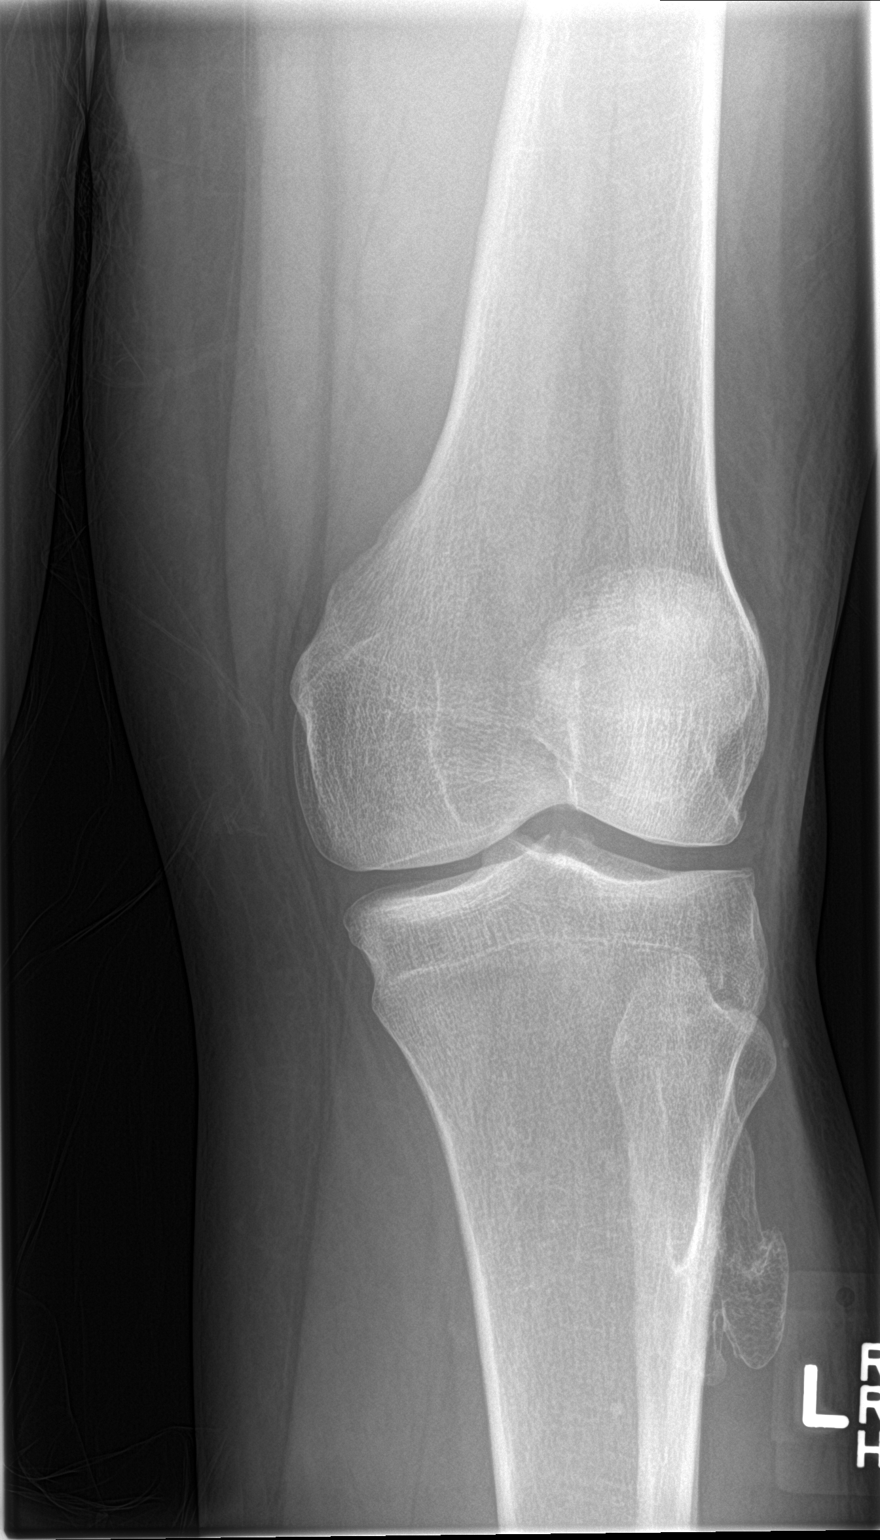

[knee lat]
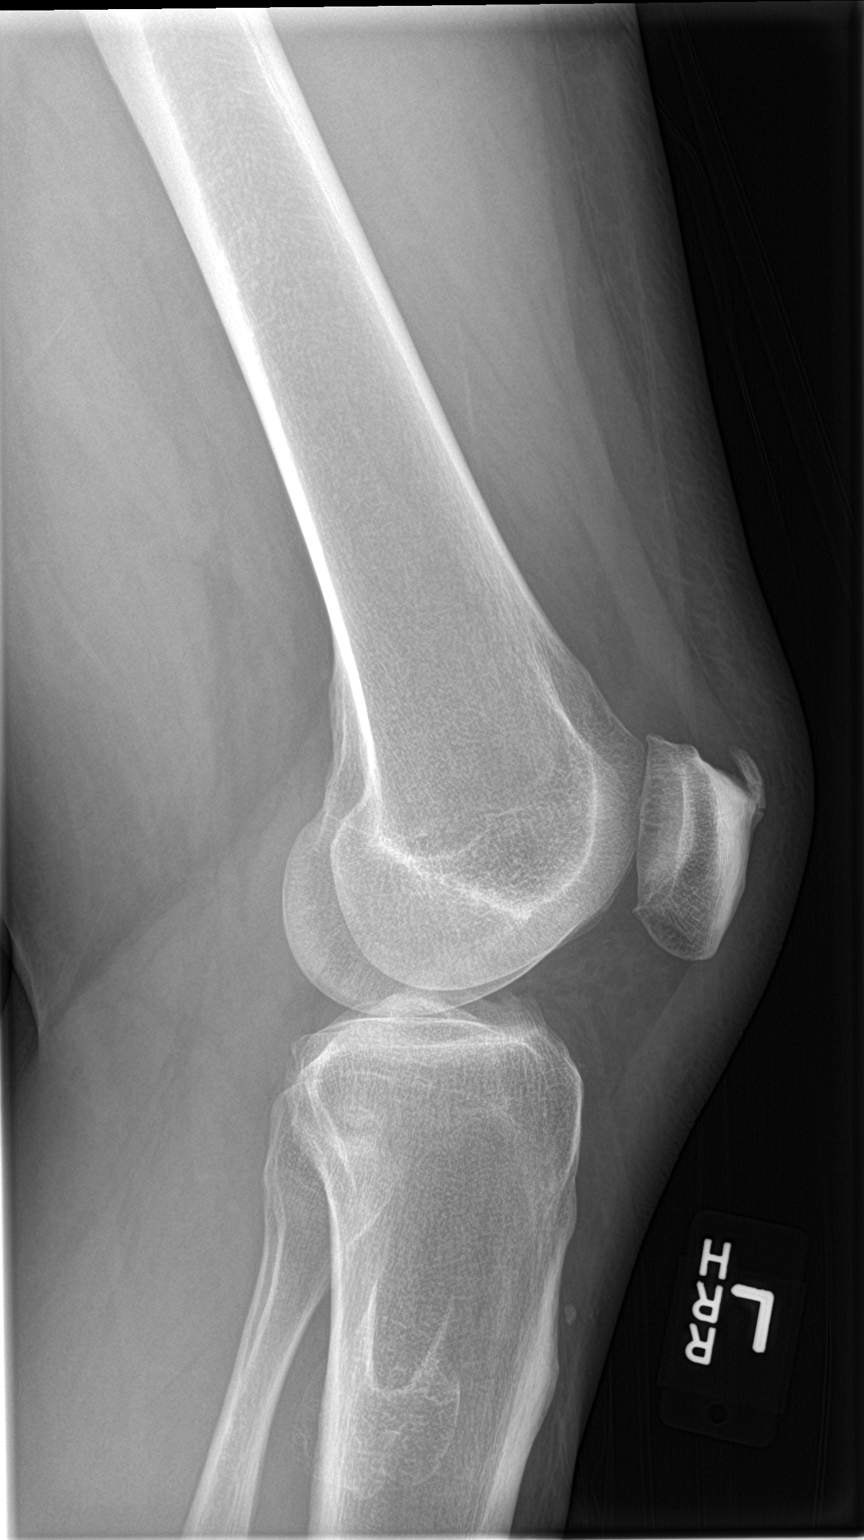

[4 of 4 positions shown; findings below may reference images not displayed]

FINDINGS: No acute bony abnormality. Specifically, no fracture, subluxation,
or dislocation. Soft tissues are intact. Exostosis noted off the
lateral proximal tibia.
IMPRESSION: No acute bony abnormality.

## 2016-12-27 ENCOUNTER — Other Ambulatory Visit (HOSPITAL_COMMUNITY): Payer: Self-pay | Admitting: Physical Medicine and Rehabilitation

## 2016-12-27 DIAGNOSIS — M961 Postlaminectomy syndrome, not elsewhere classified: Secondary | ICD-10-CM

## 2017-01-01 ENCOUNTER — Ambulatory Visit (HOSPITAL_COMMUNITY): Admission: RE | Admit: 2017-01-01 | Payer: Medicare Other | Source: Ambulatory Visit

## 2017-01-01 IMAGING — DX DG LUMBAR SPINE COMPLETE 4+V
5 series · 5 of 5 positions shown · non-contrast
Comparison: CT scan 09/10/2014

CLINICAL DATA: Fell today and injured back and right hip.

EXAM:
DG HIP (WITH OR WITHOUT PELVIS) 2-3V RIGHT; LUMBAR SPINE - COMPLETE
4+ VIEW

[l-spine ap]
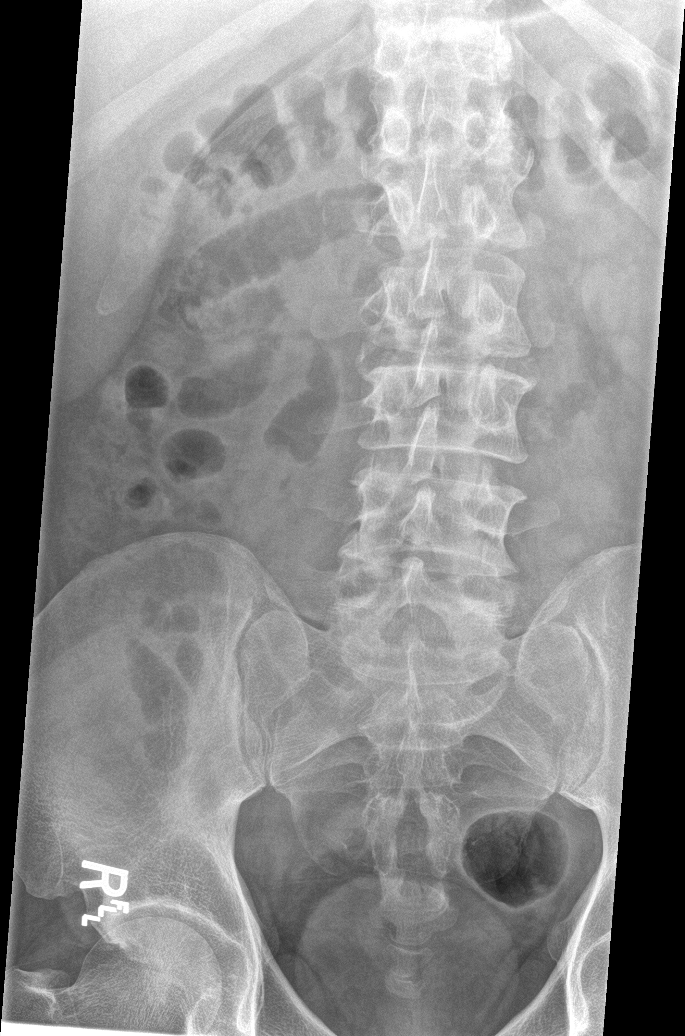

[l-spine obl (1 of 2)]
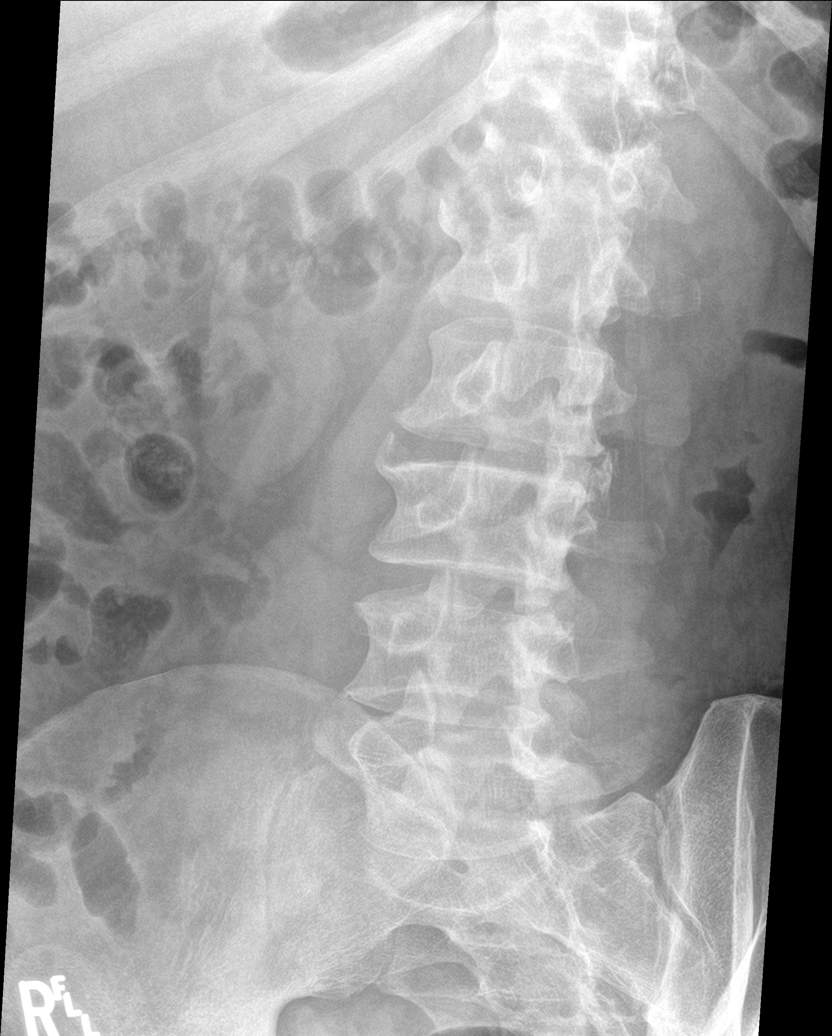

[l-spine obl (2 of 2)]
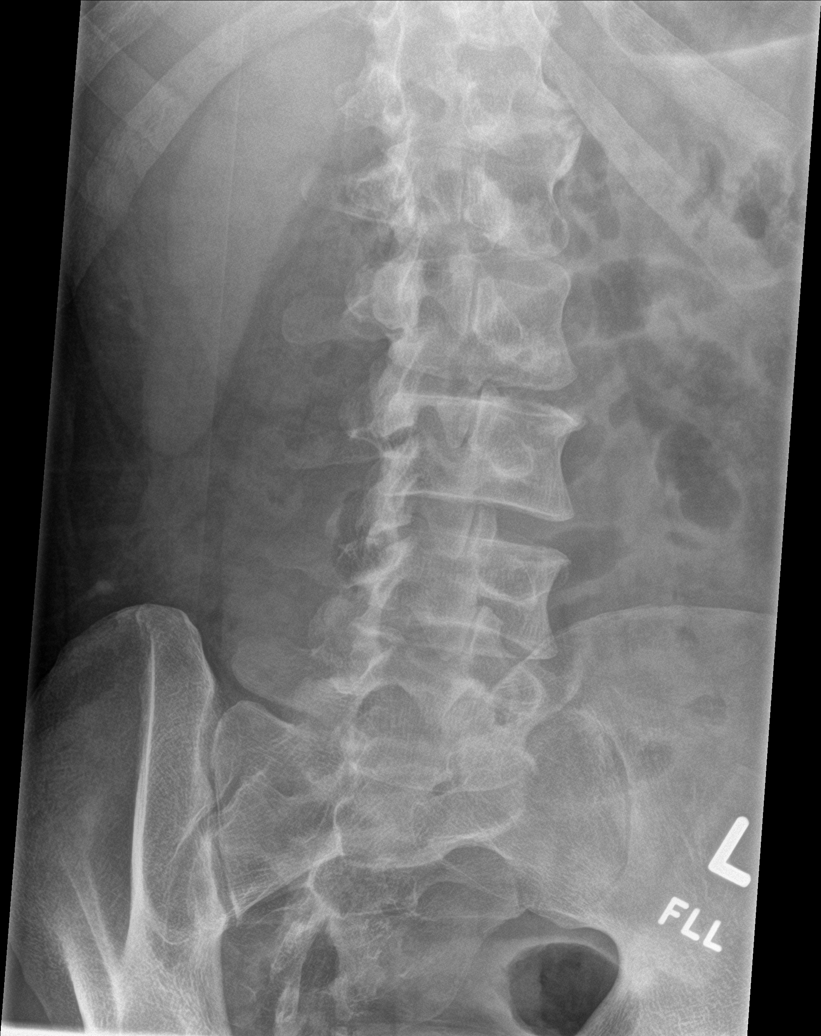

[l-spine lat]
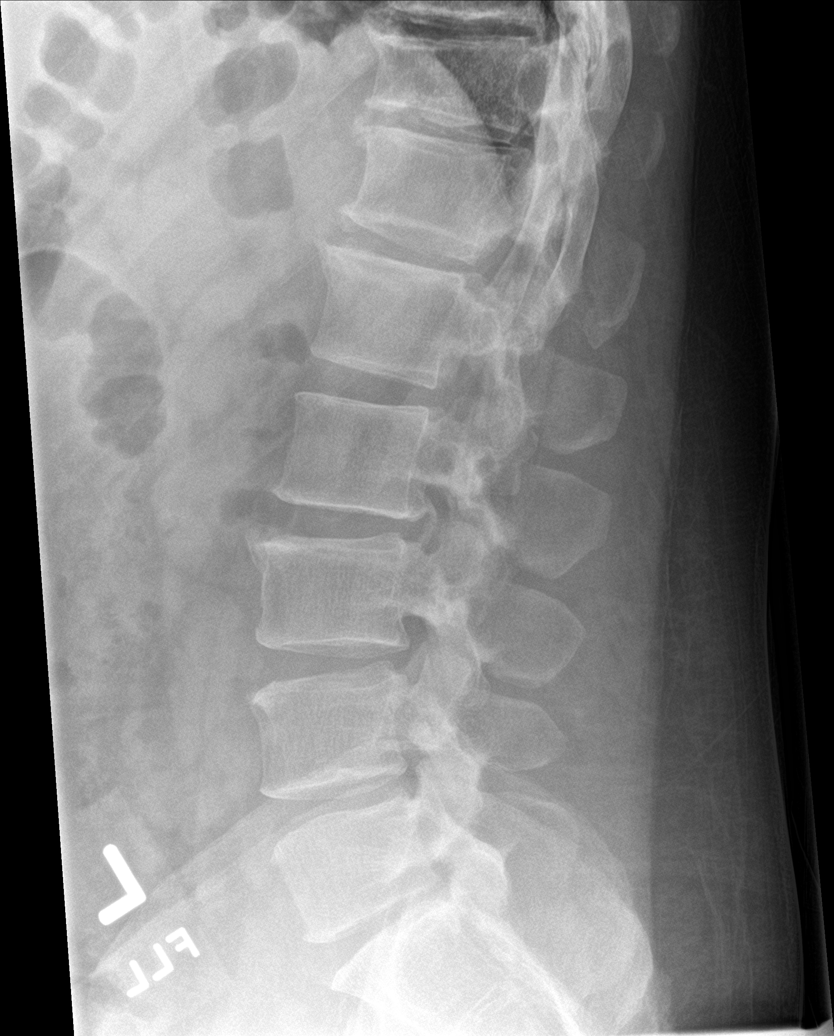

[l-spine spot]
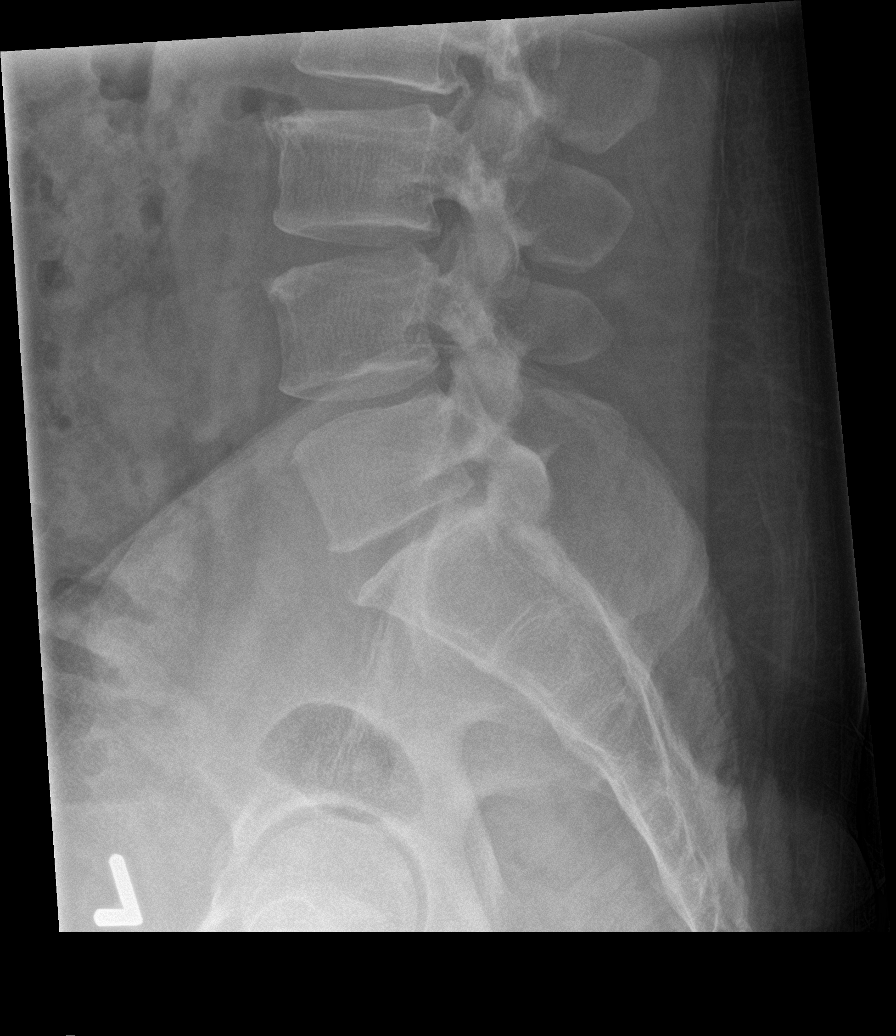

[5 of 5 positions shown; findings below may reference images not displayed]

FINDINGS: Right hip and pelvis:

Both hips are normally located. No acute fracture or plain film
evidence of avascular necrosis. The pubic symphysis and SI joints
are intact. No pelvic fractures are identified.

Lumbar spine:

Normal alignment of the lumbar vertebral bodies. Disc spaces and
vertebral bodies are maintained. No acute fracture. Mild
degenerative changes. No pars defects. Stable wedge like deformities
of the lower thoracic vertebral bodies and age advanced degenerative
changes.
IMPRESSION: 1. Thoracolumbar degenerative changes but no acute bony abnormality.
2. No acute pelvic or hip fracture.

## 2017-01-08 ENCOUNTER — Other Ambulatory Visit: Payer: Self-pay | Admitting: Neurological Surgery

## 2017-01-08 ENCOUNTER — Ambulatory Visit (HOSPITAL_COMMUNITY): Payer: Medicare Other

## 2017-01-08 DIAGNOSIS — M961 Postlaminectomy syndrome, not elsewhere classified: Secondary | ICD-10-CM

## 2017-01-21 ENCOUNTER — Ambulatory Visit
Admission: RE | Admit: 2017-01-21 | Discharge: 2017-01-21 | Disposition: A | Discharge status: Home / Self Care | Payer: Medicare Other | Source: Ambulatory Visit | Attending: Neurological Surgery | Admitting: Neurological Surgery

## 2017-01-21 VITALS — BP 126/67 | HR 69

## 2017-01-21 DIAGNOSIS — Z981 Arthrodesis status: Secondary | ICD-10-CM

## 2017-01-21 DIAGNOSIS — M961 Postlaminectomy syndrome, not elsewhere classified: Secondary | ICD-10-CM

## 2017-01-21 MED ORDER — IOPAMIDOL (ISOVUE-M 200) INJECTION 41%: 15.0000 mL | Freq: Once | INTRAMUSCULAR | Status: DC

## 2017-01-21 MED ORDER — DIAZEPAM 5 MG PO TABS
10.0000 mg | ORAL_TABLET | Freq: Once | ORAL | Status: AC
  Administered 2017-01-21: 10 mg via ORAL

## 2017-01-21 MED ORDER — MEPERIDINE HCL 100 MG/ML IJ SOLN
75.0000 mg | Freq: Once | INTRAMUSCULAR | Status: AC
  Administered 2017-01-21: 75 mg via INTRAMUSCULAR

## 2017-01-21 MED ORDER — HYDROXYZINE HCL 50 MG/ML IM SOLN
25.0000 mg | Freq: Once | INTRAMUSCULAR | Status: AC
  Administered 2017-01-21: 25 mg via INTRAMUSCULAR

## 2017-01-21 NOTE — Discharge Instructions (Signed)

## 2017-03-11 ENCOUNTER — Emergency Department (HOSPITAL_COMMUNITY)
Admission: EM | Admit: 2017-03-11 | Discharge: 2017-03-11 | Disposition: A | Discharge status: Home / Self Care | Payer: Medicare Other | Attending: Emergency Medicine | Admitting: Emergency Medicine

## 2017-03-11 ENCOUNTER — Encounter (HOSPITAL_COMMUNITY): Payer: Self-pay | Admitting: *Deleted

## 2017-03-11 DIAGNOSIS — G8929 Other chronic pain: Secondary | ICD-10-CM

## 2017-03-11 DIAGNOSIS — M62830 Muscle spasm of back: Secondary | ICD-10-CM | POA: Insufficient documentation

## 2017-03-11 DIAGNOSIS — M5136 Other intervertebral disc degeneration, lumbar region: Secondary | ICD-10-CM | POA: Insufficient documentation

## 2017-03-11 DIAGNOSIS — Z87891 Personal history of nicotine dependence: Secondary | ICD-10-CM | POA: Diagnosis not present

## 2017-03-11 DIAGNOSIS — M545 Low back pain: Secondary | ICD-10-CM | POA: Diagnosis present

## 2017-03-11 DIAGNOSIS — M5440 Lumbago with sciatica, unspecified side: Secondary | ICD-10-CM | POA: Diagnosis not present

## 2017-03-11 DIAGNOSIS — Z79899 Other long term (current) drug therapy: Secondary | ICD-10-CM | POA: Insufficient documentation

## 2017-03-11 MED ORDER — DICLOFENAC SODIUM 75 MG PO TBEC
75.0000 mg | DELAYED_RELEASE_TABLET | Freq: Two times a day (BID) | ORAL | 0 refills | Status: DC
Start: 2017-03-11 — End: 2023-12-23

## 2017-03-11 MED ORDER — DEXAMETHASONE 4 MG PO TABS: 4.0000 mg | ORAL_TABLET | Freq: Two times a day (BID) | ORAL | 0 refills | Status: DC

## 2017-03-11 MED ORDER — CYCLOBENZAPRINE HCL 10 MG PO TABS: 10.0000 mg | ORAL_TABLET | Freq: Three times a day (TID) | ORAL | 0 refills | Status: DC

## 2017-03-11 NOTE — ED Provider Notes (Signed)
[facility] EMERGENCY DEPARTMENT Provider Note   CSN: [id number] Arrival date & time: 03/11/17  1159     History   Chief Complaint Chief Complaint  Patient presents with  . Back Pain    HPI Manuel Allen is a 36 y.o. male.  Patient reports he had surgery in March, and he is also been told by the neurosurgeon that he has to disc that are problems.  Patient is concerned that he may have done more damage following a recent fall.   The history is provided by the patient.  Back Pain   Chronicity: acute on chronic. The current episode started more than 2 days ago. The problem has been gradually worsening. The pain is associated with falling. The pain is present in the lumbar spine. The quality of the pain is described as shooting. The pain does not radiate. The pain is moderate. Exacerbated by: standing. The pain is the same all the time. Pertinent negatives include no chest pain, no fever, no abdominal pain, no bowel incontinence, no perianal numbness, no dysuria, no paresthesias and no weakness. He has tried heat for the symptoms.    Past Medical History:  Diagnosis Date  . Chronic lower back pain   . Difficulty swallowing    Pt stated "I have difficulty swallowing my spit when I lay down at night"  . Family history of adverse reaction to anesthesia    Patients grandmother had a difficult time waking up; patients mother was given to much anesthesia during knee surgery on 09/28/14  . History of kidney stones   . PONV (postoperative nausea and vomiting)     Patient Active Problem List   Diagnosis Date Noted  . Wound, surgical, infected 10/05/2015  . S/P lumbar spinal fusion 08/23/2015  . 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 ABDOMINAL EXPOSURE N/A 08/23/2015   Procedure: ABDOMINAL EXPOSURE;  Surgeon: Larina Earthlyodd F Early, MD;  Location: [facility];  Service: Vascular;  Laterality: N/A;  . ANTERIOR LUMBAR  FUSION N/A 08/23/2015   Procedure: Lumbar four-five, Lumbar five-Sacral one ANTERIOR LUMBAR FUSION with anterior plating ;  Surgeon: Tia Alertavid S Jones, MD;  Location: [facility];  Service: Neurosurgery;  Laterality: N/A;  . BACK SURGERY    . FRACTURE SURGERY    . LAMINECTOMY WITH POSTERIOR LATERAL ARTHRODESIS LEVEL 2 N/A 06/13/2016   Procedure: Posterior Lateral Fusion - Lumbar four-five,  Lumbar five-sacrum one  segmental instrumentation  Lumbar four-sacrum one;  Surgeon: Tia Alertavid S Jones, MD;  [facility];  Service: Neurosurgery;  Laterality: N/A;  . ORIF RADIAL FRACTURE Left 1990s   Open treatment internal fixation left forearm radius plate  . POSTERIOR LUMBAR FUSION  06/13/2016  . WOUND EXPLORATION Left 10/06/2015   Procedure: WOUND EXPLORATION;  Surgeon: Chuck Hinthristopher S Dickson, MD;  [facility];  Service: Vascular;  Laterality: Left;       Home Medications    Prior to Admission medications   Medication Sig Start Date End Date Taking? Authorizing Provider  gabapentin (NEURONTIN) 300 MG capsule Take 300 mg by mouth 3 (three) times daily.    [provider]  HYDROcodone-acetaminophen (NORCO/VICODIN) 5-325 MG tablet Take one-two tabs po q 4-6 hrs prn pain 09/05/16   Triplett, Tammy, PA-C  methocarbamol (ROBAXIN) 500 MG tablet Take 1 tablet (500 mg total) by mouth 3 (three) times daily. Patient not taking: Reported on 01/09/2017 09/05/16   Pauline Ausriplett, Tammy, PA-C  Family History Family History  Problem Relation Age of Onset  . Hypertension Mother   . Cancer Other   . Diabetes Other     Social History Social History   Tobacco Use  . Smoking status: Former Smoker    Packs/day: 1.00    Years: 26.00    Pack years: 26.00    Types: Cigarettes    Last attempt to quit: 02/25/2017    Years since quitting: 0.0  . Smokeless tobacco: Former Neurosurgeon    Types: Chew  . Tobacco comment: 06/13/2016 "quit chewing ~ 2014"  Substance Use Topics  . Alcohol use: No    Alcohol/week: 0.0 oz    . Drug use: No     Allergies   Codeine   Review of Systems Review of Systems  Constitutional: Negative for activity change and fever.       All ROS Neg except as noted in HPI  HENT: Negative for nosebleeds.   Eyes: Negative for photophobia and discharge.  Respiratory: Negative for cough, shortness of breath and wheezing.   Cardiovascular: Negative for chest pain and palpitations.  Gastrointestinal: Negative for abdominal pain, blood in stool and bowel incontinence.  Genitourinary: Negative for dysuria, frequency and hematuria.  Musculoskeletal: Positive for back pain. Negative for arthralgias and neck pain.  Skin: Negative.   Neurological: Negative for dizziness, seizures, speech difficulty, weakness and paresthesias.  Psychiatric/Behavioral: Negative for confusion and hallucinations.     Physical Exam Updated Vital Signs BP (!) 143/98 (BP Location: Right Arm)   Pulse 90   Temp 97.7 F (36.5 C) (Oral)   Resp 17   Ht 6' (1.829 m)   Wt 108.9 kg (240 lb)   SpO2 97%   BMI 32.55 kg/m   Physical Exam  Constitutional: He is oriented to person, place, and time. He appears well-developed and well-nourished.  Non-toxic appearance.  HENT:  Head: Normocephalic.  Right Ear: Tympanic membrane and external ear normal.  Left Ear: Tympanic membrane and external ear normal.  Eyes: EOM and lids are normal. Pupils are equal, round, and reactive to light.  Neck: Normal range of motion. Neck supple. Carotid bruit is not present.  Cardiovascular: Normal rate, regular rhythm, normal heart sounds, intact distal pulses and normal pulses.  Pulmonary/Chest: Breath sounds normal. No respiratory distress.  Abdominal: Soft. Bowel sounds are normal. There is no tenderness. There is no guarding.  Musculoskeletal:       Lumbar back: He exhibits decreased range of motion, pain and spasm.       Back:  Lymphadenopathy:       Head (right side): No submandibular adenopathy present.       Head (left  side): No submandibular adenopathy present.    He has no cervical adenopathy.  Neurological: He is alert and oriented to person, place, and time. No cranial nerve deficit or sensory deficit. He exhibits normal muscle tone. Gait normal.  There are no sensory deficits noted of the lower extremities.  There is no saddle paresthesia appreciated.  Patient has difficulty with plantar flexion on the right, but states this is not new.  No other motor changes on this examination. Gait is slow but steady.  No foot drop appreciated.  Skin: Skin is warm and dry.  Psychiatric: He has a normal mood and affect. His speech is normal.  Nursing note and vitals reviewed.    ED Treatments / Results  Labs (all labs ordered are listed, but only abnormal results are displayed) Labs Reviewed - No  data to display  EKG  EKG Interpretation None       Radiology No results found.  Procedures Procedures (including critical care time)  Medications Ordered in ED Medications - No data to display   Initial Impression / Assessment and Plan / ED Course  I have reviewed the triage vital signs and the nursing notes.  Pertinent labs & imaging results that were available during my care of the patient were reviewed by me and considered in my medical decision making (see chart for details).      Final Clinical Impressions(s) / ED Diagnoses MDM Vital signs reviewed.  I reviewed the previous imaging studies in relation to the lumbar area.  No new gross neurologic deficits appreciated at this time.  Patient has pain with attempted range of motion and with walking.  No evidence for cauda equina or other emergent changes.  I suspect that he has aggravated the previous back related problems.  The patient will be treated with Decadron, diclofenac, and Flexeril.  I have asked the patient to see Dr. Yetta BarreJones as soon as possible for neuro surgery recheck of his back.  Patient is in agreement with this plan.   Final  diagnoses:  Muscle spasm of back  DDD (degenerative disc disease), lumbar  Chronic low back pain with sciatica, sciatica laterality unspecified, unspecified back pain laterality    ED Discharge Orders        Ordered    cyclobenzaprine (FLEXERIL) 10 MG tablet  3 times daily     03/11/17 1317    diclofenac (VOLTAREN) 75 MG EC tablet  2 times daily     03/11/17 1317    dexamethasone (DECADRON) 4 MG tablet  2 times daily with meals     03/11/17 1317       Ivery QualeBryant, Aurianna Earlywine, PA-C 03/11/17 1318    Bethann BerkshireZammit, Joseph, MD 03/11/17 1534

## 2017-03-11 NOTE — ED Triage Notes (Signed)
Pt with lower back pain 4 days ago, hx of same.  Per pt had back surgery last year.  Pt able to ambulate to triage room.

## 2017-03-11 NOTE — Discharge Instructions (Signed)
Your blood pressure slightly elevated at 143/98.  Otherwise your vital signs within normal limits.  There are no gross neurologic changes in your examination at this time.  I suspect that you have aggravated the previous back problems and issues that have been identified previously.  Please use a heating pad to your lower back.  Please use Decadron and diclofenac 2 times daily with food.  Use Flexeril 3 times daily for spasm pain.  Please call Dr. Yetta BarreJones today for an appointment in the office as soon as possible for recheck and evaluation of your back especially since your most recent fall.

## 2017-11-24 ENCOUNTER — Other Ambulatory Visit (HOSPITAL_COMMUNITY): Payer: Self-pay | Admitting: Family Medicine

## 2017-11-24 DIAGNOSIS — M5136 Other intervertebral disc degeneration, lumbar region: Secondary | ICD-10-CM

## 2017-12-02 ENCOUNTER — Encounter (HOSPITAL_COMMUNITY): Payer: Self-pay

## 2017-12-02 ENCOUNTER — Ambulatory Visit (HOSPITAL_COMMUNITY): Payer: Medicare Other

## 2017-12-11 ENCOUNTER — Ambulatory Visit (HOSPITAL_COMMUNITY)
Admission: RE | Admit: 2017-12-11 | Discharge: 2017-12-11 | Disposition: A | Discharge status: Home / Self Care | Payer: Medicare Other | Source: Ambulatory Visit | Attending: Family Medicine | Admitting: Family Medicine

## 2017-12-11 DIAGNOSIS — M48061 Spinal stenosis, lumbar region without neurogenic claudication: Secondary | ICD-10-CM | POA: Insufficient documentation

## 2017-12-11 DIAGNOSIS — M5136 Other intervertebral disc degeneration, lumbar region: Secondary | ICD-10-CM | POA: Insufficient documentation

## 2018-09-30 ENCOUNTER — Other Ambulatory Visit (HOSPITAL_COMMUNITY): Payer: Self-pay | Admitting: Nurse Practitioner

## 2018-09-30 DIAGNOSIS — R1032 Left lower quadrant pain: Secondary | ICD-10-CM

## 2018-10-15 ENCOUNTER — Ambulatory Visit (HOSPITAL_COMMUNITY)
Admission: RE | Admit: 2018-10-15 | Discharge: 2018-10-15 | Disposition: A | Discharge status: Home / Self Care | Payer: Medicare Other | Source: Ambulatory Visit | Attending: Nurse Practitioner | Admitting: Nurse Practitioner

## 2018-10-15 ENCOUNTER — Other Ambulatory Visit: Payer: Self-pay

## 2018-10-15 DIAGNOSIS — R1032 Left lower quadrant pain: Secondary | ICD-10-CM

## 2018-10-15 LAB — POCT I-STAT CREATININE: Creatinine, Ser: 0.9 mg/dL (ref 0.61–1.24)

## 2018-10-15 MED ORDER — IOHEXOL 300 MG/ML  SOLN
100.0000 mL | Freq: Once | INTRAMUSCULAR | Status: AC | PRN
  Administered 2018-10-15: 100 mL via INTRAVENOUS

## 2020-12-04 ENCOUNTER — Other Ambulatory Visit (HOSPITAL_COMMUNITY): Payer: Self-pay | Admitting: *Deleted

## 2020-12-04 DIAGNOSIS — M48062 Spinal stenosis, lumbar region with neurogenic claudication: Secondary | ICD-10-CM

## 2020-12-13 ENCOUNTER — Ambulatory Visit (HOSPITAL_COMMUNITY)
Admission: RE | Admit: 2020-12-13 | Discharge: 2020-12-13 | Disposition: A | Discharge status: Home / Self Care | Payer: Medicare Other | Source: Ambulatory Visit | Attending: *Deleted | Admitting: *Deleted

## 2020-12-13 ENCOUNTER — Other Ambulatory Visit: Payer: Self-pay

## 2020-12-13 DIAGNOSIS — M48062 Spinal stenosis, lumbar region with neurogenic claudication: Secondary | ICD-10-CM | POA: Insufficient documentation

## 2020-12-13 MED ORDER — GADOBUTROL 1 MMOL/ML IV SOLN
10.0000 mL | Freq: Once | INTRAVENOUS | Status: AC | PRN
  Administered 2020-12-13: 10 mL via INTRAVENOUS

## 2022-05-14 ENCOUNTER — Other Ambulatory Visit (HOSPITAL_COMMUNITY): Payer: Self-pay | Admitting: Orthopedic Surgery

## 2022-05-14 DIAGNOSIS — M545 Low back pain, unspecified: Secondary | ICD-10-CM

## 2022-05-30 ENCOUNTER — Other Ambulatory Visit (HOSPITAL_COMMUNITY): Payer: Self-pay | Admitting: Neurological Surgery

## 2022-05-30 DIAGNOSIS — M545 Low back pain, unspecified: Secondary | ICD-10-CM

## 2022-05-30 DIAGNOSIS — M48061 Spinal stenosis, lumbar region without neurogenic claudication: Secondary | ICD-10-CM

## 2022-06-25 ENCOUNTER — Ambulatory Visit (HOSPITAL_COMMUNITY)
Admission: RE | Admit: 2022-06-25 | Discharge: 2022-06-25 | Disposition: A | Discharge status: Home / Self Care | Payer: 59 | Source: Ambulatory Visit | Attending: Neurological Surgery | Admitting: Neurological Surgery

## 2022-06-25 DIAGNOSIS — M48061 Spinal stenosis, lumbar region without neurogenic claudication: Secondary | ICD-10-CM | POA: Insufficient documentation

## 2022-07-02 ENCOUNTER — Other Ambulatory Visit: Payer: Self-pay | Admitting: Neurological Surgery

## 2022-07-12 NOTE — Pre-Procedure Instructions (Signed)
Surgical Instructions    Your procedure is scheduled on Wednesday, April 10th.  Report to [facility] Main Entrance "A" at 10:15 A.M., then check in with the Admitting office.  Call this number if you have problems the morning of surgery:  [phone]  If you have any questions prior to your surgery date call [phone]: Open Monday-Friday 8am-4pm If you experience any cold or flu symptoms such as cough, fever, chills, shortness of breath, etc. between now and your scheduled surgery, please notify us at the above number.     Remember:  Do not eat or drink after midnight the night before your surgery     Take these medicines the morning of surgery with A SIP OF WATER  allopurinol (ZYLOPRIM)  buPROPion (WELLBUTRIN XL)  famotidine (PEPCID)  minocycline (MINOCIN)  omeprazole (PRILOSEC)  pregabalin (LYRICA)  oxyCODONE (ROXICODONE)- if needed   As of today, STOP taking any Aspirin (unless otherwise instructed by your surgeon) Aleve, Naproxen, Ibuprofen, Motrin, Advil, Goody's, BC's, all herbal medications, fish oil, and all vitamins.                     Do NOT Smoke (Tobacco/Vaping) for 24 hours prior to your procedure.  If you use a CPAP at night, you may bring your mask/headgear for your overnight stay.   Contacts, glasses, piercing's, hearing aid's, dentures or partials may not be worn into surgery, please bring cases for these belongings.    For patients admitted to the hospital, discharge time will be determined by your treatment team.   Patients discharged the day of surgery will not be allowed to drive home, and someone needs to stay with them for 24 hours.  SURGICAL WAITING ROOM VISITATION Patients having surgery or a procedure may have no more than 2 support people in the waiting area - these visitors may rotate.   Children under the age of 81 must have an adult with them who is not the patient. If the patient needs to stay at the hospital during part of their recovery,  the visitor guidelines for inpatient rooms apply. Pre-op nurse will coordinate an appropriate time for 1 support person to accompany patient in pre-op.  This support person may not rotate.   Please refer to the [facility] website for the visitor guidelines for Inpatients (after your surgery is over and you are in a regular room).    Special instructions:   [place]- Preparing For Surgery  Before surgery, you can play an important role. Because skin is not sterile, your skin needs to be as free of germs as possible. You can reduce the number of germs on your skin by washing with CHG (chlorahexidine gluconate) Soap before surgery.  CHG is an antiseptic cleaner which kills germs and bonds with the skin to continue killing germs even after washing.    Oral Hygiene is also important to reduce your risk of infection.  Remember - BRUSH YOUR TEETH THE MORNING OF SURGERY WITH YOUR REGULAR TOOTHPASTE  Please do not use if you have an allergy to CHG or antibacterial soaps. If your skin becomes reddened/irritated stop using the CHG.  Do not shave (including legs and underarms) for at least 48 hours prior to first CHG shower. It is OK to shave your face.  Please follow these instructions carefully.   Shower the NIGHT BEFORE SURGERY and the MORNING OF SURGERY  If you chose to wash your hair, wash your hair first as usual with your normal shampoo.  After you shampoo, rinse your hair and body thoroughly to remove the shampoo.  Use CHG Soap as you would any other liquid soap. You can apply CHG directly to the skin and wash gently with a scrungie or a clean washcloth.   Apply the CHG Soap to your body ONLY FROM THE NECK DOWN.  Do not use on open wounds or open sores. Avoid contact with your eyes, ears, mouth and genitals (private parts). Wash Face and genitals (private parts)  with your normal soap.   Wash thoroughly, paying special attention to the area where your surgery will be  performed.  Thoroughly rinse your body with warm water from the neck down.  DO NOT shower/wash with your normal soap after using and rinsing off the CHG Soap.  Pat yourself dry with a CLEAN TOWEL.  Wear CLEAN PAJAMAS to bed the night before surgery  Place CLEAN SHEETS on your bed the night before your surgery  DO NOT SLEEP WITH PETS.   Day of Surgery: Take a shower with CHG soap. Do not wear jewelry  Do not wear lotions, powders, colognes, or deodorant. Men may shave face and neck. Do not bring valuables to the hospital. [facility] is not responsible for any belongings or valuables.  Wear Clean/Comfortable clothing the morning of surgery Remember to brush your teeth WITH YOUR REGULAR TOOTHPASTE.   Please read over the following fact sheets that you were given.    If you received a COVID test during your pre-op visit  it is requested that you wear a mask when out in public, stay away from anyone that may not be feeling well and notify your surgeon if you develop symptoms. If you have been in contact with anyone that has tested positive in the last 10 days please notify you surgeon.

## 2022-07-15 ENCOUNTER — Encounter (HOSPITAL_COMMUNITY): Payer: Self-pay

## 2022-07-15 ENCOUNTER — Other Ambulatory Visit: Payer: Self-pay

## 2022-07-15 ENCOUNTER — Encounter (HOSPITAL_COMMUNITY)
Admission: RE | Admit: 2022-07-15 | Discharge: 2022-07-15 | Disposition: A | Discharge status: Home / Self Care | Payer: 59 | Source: Ambulatory Visit | Attending: Neurological Surgery | Admitting: Neurological Surgery

## 2022-07-15 VITALS — BP 157/97 | HR 90 | Temp 97.5°F | Resp 18 | Ht 69.0 in | Wt 278.8 lb

## 2022-07-15 DIAGNOSIS — N289 Disorder of kidney and ureter, unspecified: Secondary | ICD-10-CM | POA: Insufficient documentation

## 2022-07-15 DIAGNOSIS — Z01812 Encounter for preprocedural laboratory examination: Secondary | ICD-10-CM | POA: Diagnosis not present

## 2022-07-15 DIAGNOSIS — Z01818 Encounter for other preprocedural examination: Secondary | ICD-10-CM

## 2022-07-15 HISTORY — DX: Gastro-esophageal reflux disease without esophagitis: K21.9

## 2022-07-15 HISTORY — DX: Unspecified osteoarthritis, unspecified site: M19.90

## 2022-07-15 LAB — BASIC METABOLIC PANEL
Anion gap: 8 (ref 5–15)
BUN: 11 mg/dL (ref 6–20)
CO2: 26 mmol/L (ref 22–32)
Calcium: 9.2 mg/dL (ref 8.9–10.3)
Chloride: 102 mmol/L (ref 98–111)
Creatinine, Ser: 1.13 mg/dL (ref 0.61–1.24)
GFR, Estimated: >60 mL/min (ref >60)
Glucose, Bld: 122 mg/dL — ABNORMAL HIGH (ref 70–99)
Potassium: 3.4 mmol/L — ABNORMAL LOW (ref 3.5–5.1)
Sodium: 136 mmol/L (ref 135–145)

## 2022-07-15 LAB — CBC
HCT: 44.2 % (ref 39.0–52.0)
Hemoglobin: 14.7 g/dL (ref 13.0–17.0)
MCH: 29.5 pg (ref 26.0–34.0)
MCHC: 33.3 g/dL (ref 30.0–36.0)
MCV: 88.8 fL (ref 80.0–100.0)
Platelets: 168 10*3/uL (ref 150–400)
RBC: 4.98 MIL/uL (ref 4.22–5.81)
RDW: 13.2 % (ref 11.5–15.5)
WBC: 8.2 10*3/uL (ref 4.0–10.5)
nRBC: 0 % (ref 0.0–0.2)

## 2022-07-15 LAB — SURGICAL PCR SCREEN
MRSA, PCR: NEGATIVE
Staphylococcus aureus: NEGATIVE

## 2022-07-15 LAB — TYPE AND SCREEN
ABO/RH(D): O POS
Antibody Screen: NEGATIVE

## 2022-07-15 LAB — PROTIME-INR
INR: 1 (ref 0.8–1.2)
Prothrombin Time: 12.6 seconds (ref 11.4–15.2)

## 2022-07-15 NOTE — Progress Notes (Signed)
PCP - Dr. Kellie Shropshire with [facility]  Cardiologist - Denies  PPM/ICD - Denies Device Orders - n/a Rep Notified - n/a  Chest x-ray - n/a EKG - Per pt, had one with recent wellness check. Tracing requested Stress Test - Denies ECHO - Denies Cardiac Cath - Denies  Sleep Study - Denies CPAP - n/a  No DM  Last dose of GLP1 agonist- n/a GLP1 instructions: n/a  Blood Thinner Instructions: n/a Aspirin Instructions: n/a  NPO after midnight  COVID TEST- n/a   Anesthesia review: Yes. EKG tracing requested   Patient denies shortness of breath, fever, cough and chest pain at PAT appointment. Pt denies any respiratory illness/infection in the last two months.   All instructions explained to the patient, with a verbal understanding of the material. Patient agrees to go over the instructions while at home for a better understanding. Patient also instructed to self quarantine after being tested for COVID-19. The opportunity to ask questions was provided.

## 2022-07-15 NOTE — Pre-Procedure Instructions (Signed)
Surgical Instructions    Your procedure is scheduled on Wednesday, April 10th.  Report to [facility] Main Entrance "A" at 10:15 A.M., then check in with the Admitting office.  Call this number if you have problems the morning of surgery:  [phone]  If you have any questions prior to your surgery date call [phone]: Open Monday-Friday 8am-4pm If you experience any cold or flu symptoms such as cough, fever, chills, shortness of breath, etc. between now and your scheduled surgery, please notify us at the above number.     Remember:  Do not eat or drink after midnight the night before your surgery     Take these medicines the morning of surgery with A SIP OF WATER  allopurinol (ZYLOPRIM)  buPROPion (WELLBUTRIN XL)  famotidine (PEPCID)  minocycline (MINOCIN)  omeprazole (PRILOSEC)  pregabalin (LYRICA)  oxyCODONE (ROXICODONE)- if needed   As of today, STOP taking any Aspirin (unless otherwise instructed by your surgeon) Aleve, Naproxen, Ibuprofen, Motrin, Advil, Goody's, BC's, all herbal medications, fish oil, and all vitamins.                     Do NOT Smoke (Tobacco/Vaping) for 24 hours prior to your procedure.  If you use a CPAP at night, you may bring your mask/headgear for your overnight stay.   Contacts, glasses, piercing's, hearing aid's, dentures or partials may not be worn into surgery, please bring cases for these belongings.    For patients admitted to the hospital, discharge time will be determined by your treatment team.   Patients discharged the day of surgery will not be allowed to drive home, and someone needs to stay with them for 24 hours.  SURGICAL WAITING ROOM VISITATION Patients having surgery or a procedure may have no more than 2 support people in the waiting area - these visitors may rotate.   Children under the age of 65 must have an adult with them who is not the patient. If the patient needs to stay at the hospital during part of their recovery,  the visitor guidelines for inpatient rooms apply. Pre-op nurse will coordinate an appropriate time for 1 support person to accompany patient in pre-op.  This support person may not rotate.   Please refer to the [facility] website for the visitor guidelines for Inpatients (after your surgery is over and you are in a regular room).    Special instructions:   [place]- Preparing For Surgery  Before surgery, you can play an important role. Because skin is not sterile, your skin needs to be as free of germs as possible. You can reduce the number of germs on your skin by washing with CHG (chlorahexidine gluconate) Soap before surgery.  CHG is an antiseptic cleaner which kills germs and bonds with the skin to continue killing germs even after washing.    Oral Hygiene is also important to reduce your risk of infection.  Remember - BRUSH YOUR TEETH THE MORNING OF SURGERY WITH YOUR REGULAR TOOTHPASTE  Please do not use if you have an allergy to CHG or antibacterial soaps. If your skin becomes reddened/irritated stop using the CHG.  Do not shave (including legs and underarms) for at least 48 hours prior to first CHG shower. It is OK to shave your face.   Please read over the following fact sheets that you were given.    If you received a COVID test during your pre-op visit  it is requested that you wear a mask when  out in public, stay away from anyone that may not be feeling well and notify your surgeon if you develop symptoms. If you have been in contact with anyone that has tested positive in the last 10 days please notify you surgeon.

## 2022-07-16 NOTE — Progress Notes (Signed)
EKG received from [facility] was dated 06/24/17 and showed NSR.  Myra Gianotti, PA-C Surgical Short Stay/Anesthesiology [facility] Phone [phone] [facility] Phone [phone] 07/16/2022 6:08 PM

## 2022-07-24 ENCOUNTER — Ambulatory Visit (HOSPITAL_COMMUNITY): Payer: 59

## 2022-07-24 ENCOUNTER — Encounter (HOSPITAL_COMMUNITY): Payer: Self-pay | Admitting: Neurological Surgery

## 2022-07-24 ENCOUNTER — Observation Stay (HOSPITAL_COMMUNITY)
Admission: RE | Admit: 2022-07-24 | Discharge: 2022-07-25 | Disposition: A | Discharge status: Home / Self Care | Payer: 59 | Attending: Neurological Surgery | Admitting: Neurological Surgery

## 2022-07-24 ENCOUNTER — Ambulatory Visit (HOSPITAL_COMMUNITY): Payer: 59 | Admitting: Vascular Surgery

## 2022-07-24 ENCOUNTER — Other Ambulatory Visit: Payer: Self-pay

## 2022-07-24 ENCOUNTER — Ambulatory Visit (HOSPITAL_BASED_OUTPATIENT_CLINIC_OR_DEPARTMENT_OTHER): Payer: 59 | Admitting: Registered Nurse

## 2022-07-24 ENCOUNTER — Ambulatory Visit (HOSPITAL_COMMUNITY)
Admission: RE | Disposition: A | Discharge status: Home / Self Care | Payer: Self-pay | Source: Home / Self Care | Attending: Neurological Surgery

## 2022-07-24 DIAGNOSIS — M532X6 Spinal instabilities, lumbar region: Secondary | ICD-10-CM | POA: Insufficient documentation

## 2022-07-24 DIAGNOSIS — Z6839 Body mass index (BMI) 39.0-39.9, adult: Secondary | ICD-10-CM

## 2022-07-24 DIAGNOSIS — M48061 Spinal stenosis, lumbar region without neurogenic claudication: Secondary | ICD-10-CM

## 2022-07-24 DIAGNOSIS — M1990 Unspecified osteoarthritis, unspecified site: Secondary | ICD-10-CM

## 2022-07-24 DIAGNOSIS — M48062 Spinal stenosis, lumbar region with neurogenic claudication: Principal | ICD-10-CM | POA: Insufficient documentation

## 2022-07-24 DIAGNOSIS — Z981 Arthrodesis status: Secondary | ICD-10-CM

## 2022-07-24 DIAGNOSIS — M5136 Other intervertebral disc degeneration, lumbar region: Secondary | ICD-10-CM | POA: Diagnosis not present

## 2022-07-24 DIAGNOSIS — F17210 Nicotine dependence, cigarettes, uncomplicated: Secondary | ICD-10-CM | POA: Insufficient documentation

## 2022-07-24 SURGERY — POSTERIOR LUMBAR FUSION 1 LEVEL
Anesthesia: General | Site: Spine Lumbar

## 2022-07-24 MED ORDER — FENTANYL CITRATE (PF) 250 MCG/5ML IJ SOLN
INTRAMUSCULAR | Status: AC
  Filled 2022-07-24: qty 5

## 2022-07-24 MED ORDER — DEXAMETHASONE SODIUM PHOSPHATE 10 MG/ML IJ SOLN
INTRAMUSCULAR | Status: DC | PRN
  Administered 2022-07-24: 10 mg via INTRAVENOUS

## 2022-07-24 MED ORDER — ONDANSETRON HCL 4 MG/2ML IJ SOLN
INTRAMUSCULAR | Status: AC
  Filled 2022-07-24: qty 2

## 2022-07-24 MED ORDER — LIDOCAINE 2% (20 MG/ML) 5 ML SYRINGE
INTRAMUSCULAR | Status: AC
  Filled 2022-07-24: qty 5

## 2022-07-24 MED ORDER — CHLORHEXIDINE GLUCONATE 0.12 % MT SOLN
OROMUCOSAL | Status: AC
  Administered 2022-07-24: 15 mL via OROMUCOSAL
  Filled 2022-07-24: qty 15

## 2022-07-24 MED ORDER — SCOPOLAMINE 1 MG/3DAYS TD PT72
MEDICATED_PATCH | TRANSDERMAL | Status: AC
  Filled 2022-07-24: qty 1

## 2022-07-24 MED ORDER — PREGABALIN 100 MG PO CAPS
100.0000 mg | ORAL_CAPSULE | Freq: Three times a day (TID) | ORAL | Status: DC
  Administered 2022-07-24 – 2022-07-25 (×3): 100 mg via ORAL
  Filled 2022-07-24 (×3): qty 1

## 2022-07-24 MED ORDER — KETAMINE HCL 50 MG/5ML IJ SOSY
PREFILLED_SYRINGE | INTRAMUSCULAR | Status: AC
  Filled 2022-07-24: qty 5

## 2022-07-24 MED ORDER — SUGAMMADEX SODIUM 200 MG/2ML IV SOLN
INTRAVENOUS | Status: DC | PRN
  Administered 2022-07-24: 300 mg via INTRAVENOUS

## 2022-07-24 MED ORDER — CEFAZOLIN SODIUM-DEXTROSE 2-4 GM/100ML-% IV SOLN
INTRAVENOUS | Status: AC
  Filled 2022-07-24: qty 100

## 2022-07-24 MED ORDER — MIDAZOLAM HCL 2 MG/2ML IJ SOLN
INTRAMUSCULAR | Status: AC
  Filled 2022-07-24: qty 2

## 2022-07-24 MED ORDER — SCOPOLAMINE 1 MG/3DAYS TD PT72
MEDICATED_PATCH | TRANSDERMAL | Status: DC | PRN
  Administered 2022-07-24: 1 via TRANSDERMAL

## 2022-07-24 MED ORDER — THROMBIN 5000 UNITS EX SOLR
CUTANEOUS | Status: AC
  Filled 2022-07-24: qty 5000

## 2022-07-24 MED ORDER — KETAMINE HCL 10 MG/ML IJ SOLN
INTRAMUSCULAR | Status: DC | PRN
  Administered 2022-07-24: 20 mg via INTRAVENOUS
  Administered 2022-07-24: 30 mg via INTRAVENOUS
  Administered 2022-07-24: 10 mg via INTRAVENOUS

## 2022-07-24 MED ORDER — ACETAMINOPHEN 500 MG PO TABS
1000.0000 mg | ORAL_TABLET | Freq: Once | ORAL | Status: AC
  Administered 2022-07-24: 1000 mg via ORAL

## 2022-07-24 MED ORDER — HYDROMORPHONE HCL 1 MG/ML IJ SOLN
INTRAMUSCULAR | Status: AC
  Administered 2022-07-24: 0.5 mg via INTRAVENOUS
  Filled 2022-07-24: qty 1

## 2022-07-24 MED ORDER — GABAPENTIN 300 MG PO CAPS
ORAL_CAPSULE | ORAL | Status: AC
  Administered 2022-07-24: 300 mg via ORAL
  Filled 2022-07-24: qty 1

## 2022-07-24 MED ORDER — HYDROMORPHONE HCL 1 MG/ML IJ SOLN
INTRAMUSCULAR | Status: AC
  Filled 2022-07-24: qty 0.5

## 2022-07-24 MED ORDER — HYDROMORPHONE HCL 1 MG/ML IJ SOLN
0.5000 mg | INTRAMUSCULAR | Status: DC | PRN
  Administered 2022-07-24: 0.5 mg via INTRAVENOUS
  Filled 2022-07-24: qty 0.5

## 2022-07-24 MED ORDER — MIDAZOLAM HCL 2 MG/2ML IJ SOLN
INTRAMUSCULAR | Status: DC | PRN
  Administered 2022-07-24: 2 mg via INTRAVENOUS

## 2022-07-24 MED ORDER — CYCLOBENZAPRINE HCL 10 MG PO TABS
10.0000 mg | ORAL_TABLET | Freq: Three times a day (TID) | ORAL | Status: DC
  Administered 2022-07-24 – 2022-07-25 (×3): 10 mg via ORAL
  Filled 2022-07-24 (×3): qty 1

## 2022-07-24 MED ORDER — KETOROLAC TROMETHAMINE 30 MG/ML IJ SOLN
INTRAMUSCULAR | Status: AC
  Administered 2022-07-24: 30 mg via INTRAVENOUS
  Filled 2022-07-24: qty 1

## 2022-07-24 MED ORDER — CHLORHEXIDINE GLUCONATE CLOTH 2 % EX PADS: 6.0000 | MEDICATED_PAD | Freq: Once | CUTANEOUS | Status: DC

## 2022-07-24 MED ORDER — ACETAMINOPHEN 500 MG PO TABS: 1000.0000 mg | ORAL_TABLET | ORAL | Status: AC

## 2022-07-24 MED ORDER — LIDOCAINE 2% (20 MG/ML) 5 ML SYRINGE
INTRAMUSCULAR | Status: DC | PRN
  Administered 2022-07-24: 60 mg via INTRAVENOUS

## 2022-07-24 MED ORDER — CELECOXIB 200 MG PO CAPS
200.0000 mg | ORAL_CAPSULE | Freq: Two times a day (BID) | ORAL | Status: DC
  Administered 2022-07-24 – 2022-07-25 (×2): 200 mg via ORAL
  Filled 2022-07-24 (×2): qty 1

## 2022-07-24 MED ORDER — ALLOPURINOL 300 MG PO TABS
300.0000 mg | ORAL_TABLET | Freq: Every day | ORAL | Status: DC
  Administered 2022-07-24 – 2022-07-25 (×2): 300 mg via ORAL
  Filled 2022-07-24 (×2): qty 1

## 2022-07-24 MED ORDER — DEXMEDETOMIDINE HCL IN NACL 80 MCG/20ML IV SOLN
INTRAVENOUS | Status: DC | PRN
  Administered 2022-07-24 (×3): 8 ug via BUCCAL

## 2022-07-24 MED ORDER — BUPROPION HCL ER (XL) 150 MG PO TB24
150.0000 mg | ORAL_TABLET | Freq: Every day | ORAL | Status: DC
  Administered 2022-07-24 – 2022-07-25 (×2): 150 mg via ORAL
  Filled 2022-07-24 (×2): qty 1

## 2022-07-24 MED ORDER — GABAPENTIN 300 MG PO CAPS: 300.0000 mg | ORAL_CAPSULE | ORAL | Status: AC

## 2022-07-24 MED ORDER — OXYCODONE HCL 5 MG PO TABS: 5.0000 mg | ORAL_TABLET | Freq: Once | ORAL | Status: DC | PRN

## 2022-07-24 MED ORDER — POTASSIUM CHLORIDE IN NACL 20-0.9 MEQ/L-% IV SOLN: INTRAVENOUS | Status: DC

## 2022-07-24 MED ORDER — ROCURONIUM BROMIDE 10 MG/ML (PF) SYRINGE
PREFILLED_SYRINGE | INTRAVENOUS | Status: AC
  Filled 2022-07-24: qty 10

## 2022-07-24 MED ORDER — OXYCODONE HCL 5 MG/5ML PO SOLN: 5.0000 mg | Freq: Once | ORAL | Status: DC | PRN

## 2022-07-24 MED ORDER — SENNA 8.6 MG PO TABS
1.0000 | ORAL_TABLET | Freq: Two times a day (BID) | ORAL | Status: DC
  Administered 2022-07-24 – 2022-07-25 (×2): 8.6 mg via ORAL
  Filled 2022-07-24 (×2): qty 1

## 2022-07-24 MED ORDER — AMISULPRIDE (ANTIEMETIC) 5 MG/2ML IV SOLN: 10.0000 mg | Freq: Once | INTRAVENOUS | Status: DC | PRN

## 2022-07-24 MED ORDER — CEFAZOLIN IN SODIUM CHLORIDE 3-0.9 GM/100ML-% IV SOLN
3.0000 g | INTRAVENOUS | Status: DC
  Filled 2022-07-24: qty 100

## 2022-07-24 MED ORDER — THROMBIN 20000 UNITS EX SOLR: CUTANEOUS | Status: DC | PRN

## 2022-07-24 MED ORDER — HYDROMORPHONE HCL 1 MG/ML IJ SOLN
INTRAMUSCULAR | Status: DC | PRN
  Administered 2022-07-24: .5 mg via INTRAVENOUS

## 2022-07-24 MED ORDER — DEXAMETHASONE 4 MG PO TABS
4.0000 mg | ORAL_TABLET | Freq: Four times a day (QID) | ORAL | Status: DC
  Administered 2022-07-24 – 2022-07-25 (×3): 4 mg via ORAL
  Filled 2022-07-24 (×3): qty 1

## 2022-07-24 MED ORDER — KETOROLAC TROMETHAMINE 30 MG/ML IJ SOLN: 30.0000 mg | Freq: Once | INTRAMUSCULAR | Status: AC | PRN

## 2022-07-24 MED ORDER — ROCURONIUM BROMIDE 10 MG/ML (PF) SYRINGE
PREFILLED_SYRINGE | INTRAVENOUS | Status: DC | PRN
  Administered 2022-07-24: 100 mg via INTRAVENOUS
  Administered 2022-07-24: 20 mg via INTRAVENOUS

## 2022-07-24 MED ORDER — DEXTROSE 5 % IV SOLN
INTRAVENOUS | Status: DC | PRN
  Administered 2022-07-24: 3 g via INTRAVENOUS

## 2022-07-24 MED ORDER — PHENOL 1.4 % MT LIQD: 1.0000 | OROMUCOSAL | Status: DC | PRN

## 2022-07-24 MED ORDER — ALBUMIN HUMAN 5 % IV SOLN: INTRAVENOUS | Status: DC | PRN

## 2022-07-24 MED ORDER — ALUM & MAG HYDROXIDE-SIMETH 200-200-20 MG/5ML PO SUSP
30.0000 mL | ORAL | Status: DC | PRN
  Administered 2022-07-24: 30 mL via ORAL

## 2022-07-24 MED ORDER — ONDANSETRON HCL 4 MG/2ML IJ SOLN
INTRAMUSCULAR | Status: DC | PRN
  Administered 2022-07-24: 4 mg via INTRAVENOUS

## 2022-07-24 MED ORDER — LACTATED RINGERS IV SOLN: INTRAVENOUS | Status: DC

## 2022-07-24 MED ORDER — CEFAZOLIN SODIUM-DEXTROSE 2-4 GM/100ML-% IV SOLN
2.0000 g | Freq: Three times a day (TID) | INTRAVENOUS | Status: AC
  Administered 2022-07-24 – 2022-07-25 (×2): 2 g via INTRAVENOUS
  Filled 2022-07-24 (×2): qty 100

## 2022-07-24 MED ORDER — ORAL CARE MOUTH RINSE: 15.0000 mL | Freq: Once | OROMUCOSAL | Status: AC

## 2022-07-24 MED ORDER — SODIUM CHLORIDE 0.9 % IV SOLN: 250.0000 mL | INTRAVENOUS | Status: DC

## 2022-07-24 MED ORDER — 0.9 % SODIUM CHLORIDE (POUR BTL) OPTIME
TOPICAL | Status: DC | PRN
  Administered 2022-07-24: 1000 mL

## 2022-07-24 MED ORDER — MEPERIDINE HCL 25 MG/ML IJ SOLN: 6.2500 mg | INTRAMUSCULAR | Status: DC | PRN

## 2022-07-24 MED ORDER — MENTHOL 3 MG MT LOZG: 1.0000 | LOZENGE | OROMUCOSAL | Status: DC | PRN

## 2022-07-24 MED ORDER — DEXAMETHASONE SODIUM PHOSPHATE 4 MG/ML IJ SOLN: 4.0000 mg | Freq: Four times a day (QID) | INTRAMUSCULAR | Status: DC

## 2022-07-24 MED ORDER — DEXAMETHASONE SODIUM PHOSPHATE 10 MG/ML IJ SOLN
INTRAMUSCULAR | Status: AC
  Filled 2022-07-24: qty 1

## 2022-07-24 MED ORDER — SODIUM CHLORIDE 0.9% FLUSH: 3.0000 mL | INTRAVENOUS | Status: DC | PRN

## 2022-07-24 MED ORDER — BUPIVACAINE HCL (PF) 0.25 % IJ SOLN
INTRAMUSCULAR | Status: AC
  Filled 2022-07-24: qty 30

## 2022-07-24 MED ORDER — BUPIVACAINE HCL (PF) 0.25 % IJ SOLN
INTRAMUSCULAR | Status: DC | PRN
  Administered 2022-07-24: 10 mL

## 2022-07-24 MED ORDER — ALUM & MAG HYDROXIDE-SIMETH 200-200-20 MG/5ML PO SUSP: 15.0000 mL | ORAL | Status: DC | PRN

## 2022-07-24 MED ORDER — ONDANSETRON HCL 4 MG/2ML IJ SOLN: 4.0000 mg | Freq: Once | INTRAMUSCULAR | Status: DC | PRN

## 2022-07-24 MED ORDER — THROMBIN 20000 UNITS EX SOLR
CUTANEOUS | Status: AC
  Filled 2022-07-24: qty 20000

## 2022-07-24 MED ORDER — THROMBIN 5000 UNITS EX SOLR
OROMUCOSAL | Status: DC | PRN
  Administered 2022-07-24: 5 mL via TOPICAL

## 2022-07-24 MED ORDER — SODIUM CHLORIDE 0.9% FLUSH
3.0000 mL | Freq: Two times a day (BID) | INTRAVENOUS | Status: DC
  Administered 2022-07-24: 3 mL via INTRAVENOUS

## 2022-07-24 MED ORDER — CHLORHEXIDINE GLUCONATE 0.12 % MT SOLN: 15.0000 mL | Freq: Once | OROMUCOSAL | Status: AC

## 2022-07-24 MED ORDER — ONDANSETRON HCL 4 MG/2ML IJ SOLN: 4.0000 mg | Freq: Four times a day (QID) | INTRAMUSCULAR | Status: DC | PRN

## 2022-07-24 MED ORDER — ONDANSETRON HCL 4 MG PO TABS: 4.0000 mg | ORAL_TABLET | Freq: Four times a day (QID) | ORAL | Status: DC | PRN

## 2022-07-24 MED ORDER — ACETAMINOPHEN 500 MG PO TABS
1000.0000 mg | ORAL_TABLET | Freq: Four times a day (QID) | ORAL | Status: DC
  Administered 2022-07-24 – 2022-07-25 (×3): 1000 mg via ORAL
  Filled 2022-07-24 (×3): qty 2

## 2022-07-24 MED ORDER — GLYCOPYRROLATE PF 0.2 MG/ML IJ SOSY
PREFILLED_SYRINGE | INTRAMUSCULAR | Status: AC
  Filled 2022-07-24: qty 1

## 2022-07-24 MED ORDER — ACETAMINOPHEN 500 MG PO TABS
ORAL_TABLET | ORAL | Status: AC
  Filled 2022-07-24: qty 2

## 2022-07-24 MED ORDER — HYDROMORPHONE HCL 1 MG/ML IJ SOLN
0.2500 mg | INTRAMUSCULAR | Status: DC | PRN
  Administered 2022-07-24: 0.5 mg via INTRAVENOUS

## 2022-07-24 MED ORDER — MINOCYCLINE HCL 50 MG PO CAPS
100.0000 mg | ORAL_CAPSULE | Freq: Two times a day (BID) | ORAL | Status: DC
  Administered 2022-07-25 (×2): 100 mg via ORAL
  Filled 2022-07-24 (×2): qty 2
  Filled 2022-07-24: qty 1

## 2022-07-24 MED ORDER — PROPOFOL 10 MG/ML IV BOLUS
INTRAVENOUS | Status: DC | PRN
  Administered 2022-07-24: 30 ug/kg/min via INTRAVENOUS
  Administered 2022-07-24: 200 mg via INTRAVENOUS

## 2022-07-24 MED ORDER — FENTANYL CITRATE (PF) 250 MCG/5ML IJ SOLN
INTRAMUSCULAR | Status: DC | PRN
  Administered 2022-07-24 (×2): 50 ug via INTRAVENOUS
  Administered 2022-07-24: 100 ug via INTRAVENOUS

## 2022-07-24 MED ORDER — OXYCODONE HCL 5 MG PO TABS
15.0000 mg | ORAL_TABLET | ORAL | Status: DC | PRN
  Administered 2022-07-24 – 2022-07-25 (×5): 15 mg via ORAL
  Filled 2022-07-24 (×5): qty 3

## 2022-07-24 MED ORDER — FAMOTIDINE 20 MG PO TABS
20.0000 mg | ORAL_TABLET | Freq: Two times a day (BID) | ORAL | Status: DC
  Administered 2022-07-24 – 2022-07-25 (×2): 20 mg via ORAL
  Filled 2022-07-24 (×2): qty 1

## 2022-07-24 SURGICAL SUPPLY — 59 items
ADH SKN CLS APL DERMABOND .7 (GAUZE/BANDAGES/DRESSINGS) ×1
APL SKNCLS STERI-STRIP NONHPOA (GAUZE/BANDAGES/DRESSINGS) ×1
BAG COUNTER SPONGE SURGICOUNT (BAG) ×1 IMPLANT
BAG SPNG CNTER NS LX DISP (BAG) ×1
BASKET BONE COLLECTION (BASKET) ×1 IMPLANT
BENZOIN TINCTURE PRP APPL 2/3 (GAUZE/BANDAGES/DRESSINGS) ×1 IMPLANT
BLADE BONE MILL MEDIUM (MISCELLANEOUS) ×1 IMPLANT
BLADE CLIPPER SURG (BLADE) IMPLANT
BUR CARBIDE MATCH 3.0 (BURR) ×1 IMPLANT
CANISTER SUCT 3000ML PPV (MISCELLANEOUS) ×1 IMPLANT
CNTNR URN SCR LID CUP LEK RST (MISCELLANEOUS) ×1 IMPLANT
CONT SPEC 4OZ STRL OR WHT (MISCELLANEOUS) ×1
COVER BACK TABLE 60X90IN (DRAPES) ×1 IMPLANT
DERMABOND ADVANCED .7 DNX12 (GAUZE/BANDAGES/DRESSINGS) ×1 IMPLANT
DRAPE C-ARM 42X72 X-RAY (DRAPES) ×2 IMPLANT
DRAPE LAPAROTOMY 100X72X124 (DRAPES) ×1 IMPLANT
DRAPE SURG 17X23 STRL (DRAPES) ×1 IMPLANT
DRSG OPSITE POSTOP 4X6 (GAUZE/BANDAGES/DRESSINGS) IMPLANT
DURAPREP 26ML APPLICATOR (WOUND CARE) ×1 IMPLANT
ELECT REM PT RETURN 9FT ADLT (ELECTROSURGICAL) ×1
ELECTRODE REM PT RTRN 9FT ADLT (ELECTROSURGICAL) ×1 IMPLANT
FIBER BONE ALLOSYNC EXPAND 5 (Bone Implant) IMPLANT
GAUZE 4X4 16PLY ~~LOC~~+RFID DBL (SPONGE) IMPLANT
GLOVE BIO SURGEON STRL SZ7 (GLOVE) IMPLANT
GLOVE BIO SURGEON STRL SZ8 (GLOVE) ×2 IMPLANT
GLOVE BIOGEL PI IND STRL 7.0 (GLOVE) IMPLANT
GOWN STRL REUS W/ TWL LRG LVL3 (GOWN DISPOSABLE) IMPLANT
GOWN STRL REUS W/ TWL XL LVL3 (GOWN DISPOSABLE) ×2 IMPLANT
GOWN STRL REUS W/TWL 2XL LVL3 (GOWN DISPOSABLE) IMPLANT
GOWN STRL REUS W/TWL LRG LVL3 (GOWN DISPOSABLE) ×2
GOWN STRL REUS W/TWL XL LVL3 (GOWN DISPOSABLE) ×4
HEMOSTAT POWDER KIT SURGIFOAM (HEMOSTASIS) ×1 IMPLANT
IDENTI PS 11X9X25 15D (Spacer) ×2 IMPLANT
KIT BASIN OR (CUSTOM PROCEDURE TRAY) ×1 IMPLANT
KIT TURNOVER KIT B (KITS) ×1 IMPLANT
NDL HYPO 25X1 1.5 SAFETY (NEEDLE) ×1 IMPLANT
NEEDLE HYPO 25X1 1.5 SAFETY (NEEDLE) ×1 IMPLANT
NS IRRIG 1000ML POUR BTL (IV SOLUTION) ×1 IMPLANT
PACK LAMINECTOMY NEURO (CUSTOM PROCEDURE TRAY) ×1 IMPLANT
PAD ARMBOARD 7.5X6 YLW CONV (MISCELLANEOUS) ×3 IMPLANT
PUTTY DBM 5CC (Putty) IMPLANT
ROD LORD LIPPED TI 5.5X40 (Rod) IMPLANT
SCREW CANC SHANK MOD 6.5X45 (Screw) IMPLANT
SCREW POLYAXIAL TULIP (Screw) IMPLANT
SET SCREW (Screw) ×4 IMPLANT
SET SCREW SPNE (Screw) IMPLANT
SOL ELECTROSURG ANTI STICK (MISCELLANEOUS) ×1
SOLUTION ELECTROSURG ANTI STCK (MISCELLANEOUS) ×1 IMPLANT
SPACER IDENTI PS 11X9X25 15D (Spacer) IMPLANT
SPONGE SURGIFOAM ABS GEL 100 (HEMOSTASIS) ×1 IMPLANT
SPONGE T-LAP 4X18 ~~LOC~~+RFID (SPONGE) IMPLANT
STRIP CLOSURE SKIN 1/2X4 (GAUZE/BANDAGES/DRESSINGS) ×2 IMPLANT
SUT VIC AB 0 CT1 18XCR BRD8 (SUTURE) ×1 IMPLANT
SUT VIC AB 0 CT1 8-18 (SUTURE) ×1
SUT VIC AB 2-0 CP2 18 (SUTURE) ×1 IMPLANT
SUT VIC AB 3-0 SH 8-18 (SUTURE) ×2 IMPLANT
TOWEL GREEN STERILE (TOWEL DISPOSABLE) ×1 IMPLANT
TOWEL GREEN STERILE FF (TOWEL DISPOSABLE) ×1 IMPLANT
WATER STERILE IRR 1000ML POUR (IV SOLUTION) ×1 IMPLANT

## 2022-07-24 NOTE — Transfer of Care (Signed)
Immediate Anesthesia Transfer of Care Note  Patient: Manuel Allen  Procedure(s) Performed: LUMBAR THREE-FOUR POSTERIOR LUMBAR INTERBODY FUSION WITH REMOVAL OF LUMBAR FOUR-SACRAL ONE HARDWARE (Spine Lumbar)  Patient Location: PACU  Anesthesia Type:General  Level of Consciousness: responds to stimulation  Airway & Oxygen Therapy: Patient Spontanous Breathing and Patient connected to face mask oxygen  Post-op Assessment: Report given to RN and Post -op Vital signs reviewed and stable  Post vital signs: Reviewed and stable  Last Vitals:  Vitals Value Taken Time  BP 126/73 07/24/22 1500  Temp    Pulse 85 07/24/22 1502  Resp 12 07/24/22 1502  SpO2 94 % 07/24/22 1502  Vitals shown include unvalidated device data.  Last Pain:  Vitals:   07/24/22 1036  TempSrc:   PainSc: 9       Patients Stated Pain Goal: 5 (07/24/22 1036)  Complications: No notable events documented.

## 2022-07-24 NOTE — Anesthesia Preprocedure Evaluation (Addendum)
Anesthesia Evaluation  Patient identified by MRN, date of birth, ID band Patient awake    Reviewed: Allergy & Precautions, H&P , NPO status , Patient's Chart, lab work & pertinent test results  History of Anesthesia Complications (+) PONV and history of anesthetic complications  Airway Mallampati: IV  TM Distance: >3 FB Neck ROM: Full    Dental  (+) Dental Advisory Given, Poor Dentition, Chipped,    Pulmonary Current Smoker and Patient abstained from smoking. Current smoker, 26 pack year history  1ppd    Pulmonary exam normal breath sounds clear to auscultation       Cardiovascular negative cardio ROS Normal cardiovascular exam Rhythm:Regular Rate:Normal     Neuro/Psych negative neurological ROS  negative psych ROS   GI/Hepatic Neg liver ROS,GERD  Medicated and Controlled,,  Endo/Other    Morbid obesityBMI 40  Renal/GU negative Renal ROS  negative genitourinary   Musculoskeletal  (+) Arthritis , Osteoarthritis,    Abdominal  (+) + obese  Peds negative pediatric ROS (+)  Hematology negative hematology ROS (+)   Anesthesia Other Findings   Reproductive/Obstetrics negative OB ROS                              Anesthesia Physical Anesthesia Plan  ASA: 3  Anesthesia Plan: General   Post-op Pain Management: Tylenol PO (pre-op)*, Ketamine IV*, Dilaudid IV and Precedex   Induction: Intravenous  PONV Risk Score and Plan: 2 and Ondansetron, Dexamethasone, Midazolam and Treatment may vary due to age or medical condition  Airway Management Planned: Oral ETT  Additional Equipment: None  Intra-op Plan:   Post-operative Plan: Extubation in OR  Informed Consent: I have reviewed the patients History and Physical, chart, labs and discussed the procedure including the risks, benefits and alternatives for the proposed anesthesia with the patient or authorized representative who has indicated  his/her understanding and acceptance.     Dental advisory given  Plan Discussed with: CRNA  Anesthesia Plan Comments:        Anesthesia Quick Evaluation

## 2022-07-24 NOTE — H&P (Signed)
Subjective: Patient is a 42 y.o. 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 Onset of symptoms was several months ago, gradually worsening since that time.  The pain is rated severe, and is located at the across the lower back and radiates to legs. The pain is described as aching and occurs all day. The symptoms have been progressive. Symptoms are exacerbated by exercise, standing, and walking for more than a few minutes. 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 above previous successful L4-S1 fusion  Past Medical History:  Diagnosis Date   Arthritis    Lumbar Back   Chronic lower back pain    Difficulty swallowing    Pt stated "I have difficulty swallowing my spit when I lay down at night"   Family history of adverse reaction to anesthesia    Patients grandmother had a difficult time waking up; patients mother was given to much anesthesia during knee surgery on 09/28/14   GERD (gastroesophageal reflux disease)    History of kidney stones    PONV (postoperative nausea and vomiting)     Past Surgical History:  Procedure Laterality Date   ABDOMINAL EXPOSURE N/A 08/23/2015   Procedure: ABDOMINAL EXPOSURE;  Surgeon: Larina Earthlyodd F Early, MD;  Location: [facility] NEURO ORS;  Service: Vascular;  Laterality: N/A;   ANTERIOR LUMBAR FUSION N/A 08/23/2015   Procedure: Lumbar four-five, Lumbar five-Sacral one ANTERIOR LUMBAR FUSION with anterior plating ;  Surgeon: Tia Alertavid S Kathlen Sakurai, MD;  Location: [facility] NEUR[facility] ORS;  Service: Neurosurgery;  Laterality: N/A;   BACK SURGERY     FRACTURE SURGERY     LAMINECTOMY WITH POSTERIOR LATERAL ARTHRODESIS LEVEL 2 N/A 06/13/2016   Procedure: Posterior Lateral Fusion - Lumbar four-five,  Lumbar five-sacrum one  segmental instrumentation  Lumbar four-sacrum one;  Surgeon: Tia Alertavid S Freeman Borba, MD;  [facility];  Service: Neurosurgery;  Laterality: N/A;   ORIF RADIAL FRACTURE Left 1990s   Open treatment internal fixation left forearm radius plate    POSTERIOR LUMBAR FUSION  06/13/2016   WOUND EXPLORATION Left 10/06/2015   Procedure: WOUND EXPLORATION;  Surgeon: Chuck Hinthristopher S Dickson, MD;  [facility];  Service: Vascular;  Laterality: Left;    Prior to Admission medications   Medication Sig Start Date End Date Taking? Authorizing Provider  allopurinol (ZYLOPRIM) 300 MG tablet Take 300 mg by mouth daily.   Yes [provider]  buPROPion (WELLBUTRIN XL) 150 MG 24 hr tablet Take 150 mg by mouth daily. 05/24/21  Yes [provider]  Cholecalciferol (VITAMIN D3 PO) Take 1 tablet by mouth daily.   Yes [provider]  famotidine (PEPCID) 20 MG tablet Take 20 mg by mouth 2 (two) times daily. 06/02/21  Yes [provider]  minocycline (MINOCIN) 100 MG capsule Take 100 mg by mouth 2 (two) times daily. 07/02/22  Yes [provider]  omeprazole (PRILOSEC) 40 MG capsule Take 40 mg by mouth daily. 06/02/21  Yes [provider]  oxyCODONE (ROXICODONE) 15 MG immediate release tablet Take 15 mg by mouth 5 (five) times daily as needed for pain.   Yes [provider]  pregabalin (LYRICA) 100 MG capsule Take 100 mg by mouth 3 (three) times daily. 06/01/22  Yes [provider]  cyclobenzaprine (FLEXERIL) 10 MG tablet Take 1 tablet (10 mg total) by mouth 3 (three) times daily. 03/11/17   Ivery QualeBryant, [place], PA-C  dexamethasone (DECADRON) 4 MG tablet Take 1 tablet (4 mg total) by mouth 2 (  two) times daily with a meal. Patient not taking: Reported on 07/10/2022 03/11/17   Ivery Quale, PA-C  diclofenac (VOLTAREN) 75 MG EC tablet Take 1 tablet (75 mg total) by mouth 2 (two) times daily. Patient not taking: Reported on 07/10/2022 03/11/17   Ivery Quale, PA-C  HYDROcodone-acetaminophen (NORCO/VICODIN) 5-325 MG tablet Take one-two tabs po q 4-6 hrs prn pain Patient not taking: Reported on 07/10/2022 09/05/16   Pauline Aus, PA-C  methocarbamol (ROBAXIN) 500 MG tablet Take 1 tablet (500 mg total) by  mouth 3 (three) times daily. Patient not taking: Reported on 01/09/2017 09/05/16   Pauline Aus, PA-C   Allergies  Allergen Reactions   Tramadol Other (See Comments) and Nausea And Vomiting   Codeine Hives    Social History   Tobacco Use   Smoking status: Every Day    Packs/day: 1.00    Years: 26.00    Additional pack years: 0.00    Total pack years: 26.00    Types: Cigarettes    Last attempt to quit: 02/25/2017    Years since quitting: 5.4   Smokeless tobacco: Current    Types: Chew   Tobacco comments:    06/13/2016 "quit chewing ~ 2014"  Substance Use Topics   Alcohol use: No    Alcohol/week: 0.0 standard drinks of alcohol    Family History  Problem Relation Age of Onset   Hypertension Mother    Cancer Other    Diabetes Other      Review of Systems  Positive ROS: neg  All other systems have been reviewed and were otherwise negative with the exception of those mentioned in the HPI and as above.  Objective: Vital signs in last 24 hours: Temp:  [97.6 F (36.4 C)] 97.6 F (36.4 C) (04/10 1008) Pulse Rate:  [76] 76 (04/10 1008) Resp:  [17] 17 (04/10 1008) BP: (167)/(105) 167/105 (04/10 1008) SpO2:  [96 %] 96 % (04/10 1008) Weight:  [122.5 kg] 122.5 kg (04/10 1008)  General Appearance: Alert, cooperative, no distress, appears stated age Head: Normocephalic, without obvious abnormality, atraumatic Eyes: PERRL, conjunctiva/corneas clear, EOM's intact    Neck: Supple, symmetrical, trachea midline Back: Symmetric, no curvature, ROM normal, no CVA tenderness Lungs:  respirations unlabored Heart: Regular rate and rhythm Abdomen: Soft, non-tender Extremities: Extremities normal, atraumatic, no cyanosis or edema Pulses: 2+ and symmetric all extremities Skin: Skin color, texture, turgor normal, no rashes or lesions  NEUROLOGIC:   Mental status: Alert and oriented x4,  no aphasia, good attention span, fund of knowledge, and memory Motor Exam - grossly  normal Sensory Exam - grossly normal Reflexes: 1+ Coordination - grossly normal Gait - grossly normal Balance - grossly normal Cranial Nerves: I: smell Not tested  II: visual acuity  OS: nl    OD: nl  II: visual fields Full to confrontation  II: pupils Equal, round, reactive to light  III,VII: ptosis None  III,IV,VI: extraocular muscles  Full ROM  V: mastication Normal  V: facial light touch sensation  Normal  V,VII: corneal reflex  Present  VII: facial muscle function - upper  Normal  VII: facial muscle function - lower Normal  VIII: hearing Not tested  IX: soft palate elevation  Normal  IX,X: gag reflex Present  XI: trapezius strength  5/5  XI: sternocleidomastoid strength 5/5  XI: neck flexion strength  5/5  XII: tongue strength  Normal    Data Review Lab Results  Component Value Date   WBC 8.2 07/15/2022   HGB  14.7 07/15/2022   HCT 44.2 07/15/2022   MCV 88.8 07/15/2022   PLT 168 07/15/2022   Lab Results  Component Value Date   NA 136 07/15/2022   K 3.4 (L) 07/15/2022   CL 102 07/15/2022   CO2 26 07/15/2022   BUN 11 07/15/2022   CREATININE 1.13 07/15/2022   GLUCOSE 122 (H) 07/15/2022   Lab Results  Component Value Date   INR 1.0 07/15/2022    Assessment/Plan:  Estimated body mass index is 39.87 kg/m as calculated from the following:   Height as of this encounter: 5\' 9"  (1.753 m).   Weight as of this encounter: 122.5 kg. Patient admitted for PLIF L3-4. Patient has failed a reasonable attempt at conservative therapy.  I explained the condition and procedure to the patient and answered any questions.  Patient wishes to proceed with procedure as planned. Understands risks/ benefits and typical outcomes of procedure.   Tia Alert 07/24/2022 10:52 AM

## 2022-07-24 NOTE — Op Note (Signed)
07/24/2022  2:38 PM  PATIENT:  Manuel Allen  42 y.o. 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 Decompressive lumbar laminectomy, hemi facetectomy and foraminotomies L3-4 requiring more work than would be required for a simple exposure of the disk for PLIF in order to adequately decompress the neural elements and address the spinal stenosis 2. Posterior lumbar interbody fusion L3-4 using PTI interbody cages packed with morcellized allograft and autograft  3. Posterior fixation L3-4 using ATEC cortical pedicle screws.  4. Intertransverse arthrodesis L3-4 using morcellized autograft and allograft. 5.  Lumbar reexploration with exploration of fusion L4-S1 with removal of segmental fixation L4-S1  SURGEON:  Marikay Alar, MD  ASSISTANTS: Verlin Dike, FNP  ANESTHESIA:  General  EBL: 320 ml  Total I/O In: 1300 [I.V.:700; IV Piggyback:600] Out: 320 [Blood:320]  BLOOD ADMINISTERED:none  DRAINS: none   INDICATION FOR PROCEDURE: This patient presented with severe back pain with bilateral leg pain with leg weakness. Imaging revealed severe adjacent level stenosis L3-4 above previous L4-S1 fusion. The patient tried a reasonable attempt at conservative medical measures without relief. I recommended decompression and instrumented fusion to address the stenosis as well as the segmental  instability.  Patient understood the risks, benefits, and alternatives and potential outcomes and wished to proceed.  PROCEDURE DETAILS:  The patient was brought to the operating room. After induction of generalized endotracheal anesthesia the patient was rolled into the prone position on chest rolls and all pressure points were padded. The patient's lumbar region was cleaned and then prepped with DuraPrep and draped in the usual sterile fashion.  Anesthesia was injected and then a dorsal midline incision was made and carried down to the lumbosacral fascia. The fascia was opened and the paraspinous musculature was taken down in a subperiosteal fashion to expose L3-4 as well as the previously placed instrumentation. A self-retaining retractor was placed.  We remove the locking caps from the old pedicle screw tulip heads and then remove the rods.  The screws had good purchase.  We remove the L4 pedicle screws but left the L5 and S1 pedicle screws in place.  intraoperative fluoroscopy confirmed my level, and I started with placement of the L3 cortical pedicle screws. The pedicle screw entry zones were identified utilizing surface landmarks and  AP and lateral fluoroscopy. I scored the cortex with the high-speed drill and then used the hand drill to drill an upward and outward direction into the pedicle. I then tapped line to line. I then placed a 6.5 x 40 mm cortical pedicle screw into the pedicles of L3 bilaterally.    I then turned my attention to the decompression and complete lumbar laminectomies, hemi- facetectomies, and foraminotomies were performed at L3-4.  My nurse practitioner was directly involved in the decompression and exposure of the neural elements. the patient had significant spinal stenosis and this required more work than would be required for a simple exposure of the disc for posterior lumbar interbody fusion which would only require a limited laminotomy. Much more generous decompression and generous foraminotomy was undertaken in order to adequately decompress the neural elements and address the patient's leg pain. The yellow ligament was removed to expose the underlying dura and nerve roots, and generous foraminotomies were performed to adequately decompress the neural elements. Both the exiting and traversing nerve roots were decompressed on both sides until a  coronary dilator passed easily along the nerve roots. Once the decompression  was complete, I turned my attention to the posterior lower lumbar interbody fusion. The epidural venous vasculature was coagulated and cut sharply. Disc space was incised and the initial discectomy was performed with pituitary rongeurs. The disc space was distracted with sequential distractors to a height of 11 mm. We then used a series of scrapers and shavers to prepare the endplates for fusion. The midline was prepared with Epstein curettes. Once the complete discectomy was finished, we packed an appropriate sized interbody cage with local autograft and morcellized allograft, gently retracted the nerve root, and tapped the cage into position at L3-4.  The midline between the cages was packed with morselized autograft and allograft.   We then turned our attention to the placement of the lower pedicle screws.  We used the old pedicle screw holes and tapped these with a 6.5 tap.   We palpated with a ball probe to assure no break in the cortex. We then placed 6.5 x 45 mm pedicle screws into the pedicles bilaterally at L4.  My nurse practitioner assisted in placement of the pedicle screws.  We then decorticated the transverse processes and laid a mixture of morcellized autograft and allograft out over these to perform intertransverse arthrodesis at L3-4. We then placed lordotic rods into the multiaxial screw heads of the pedicle screws and locked these in position with the locking caps and anti-torque device. We then checked our construct with AP and lateral fluoroscopy. Irrigated with copious amounts of bacitracin-containing saline solution. Inspected the nerve roots once again to assure adequate decompression, lined to the dura with Gelfoam,  and then we closed the muscle and the fascia with 0 Vicryl. Closed the subcutaneous tissues with 2-0 Vicryl and subcuticular tissues with 3-0 Vicryl. The skin was closed with benzoin and Steri-Strips. Dressing was then applied, the patient was awakened from general anesthesia  and transported to the recovery room in stable condition. At the end of the procedure all sponge, needle and instrument counts were correct.   PLAN OF CARE: admit to inpatient  PATIENT DISPOSITION:  PACU - hemodynamically stable.   Delay start of Pharmacological VTE agent (>24hrs) due to surgical blood loss or risk of bleeding:  yes

## 2022-07-25 DIAGNOSIS — M48062 Spinal stenosis, lumbar region with neurogenic claudication: Secondary | ICD-10-CM | POA: Diagnosis not present

## 2022-07-25 MED ORDER — OXYCODONE HCL 15 MG PO TABS: 15.0000 mg | ORAL_TABLET | ORAL | 0 refills | Status: DC | PRN

## 2022-07-25 MED FILL — Thrombin For Soln 5000 Unit: CUTANEOUS | Qty: 5000 | Status: AC

## 2022-07-25 NOTE — Evaluation (Signed)
Occupational Therapy Evaluation and Discharge Patient Details Name: Manuel Allen MRN: [id number] DOB: 1980/06/24 Today's Date: 07/25/2022   History of Present Illness Manuel Allen is s/p decompressive lumbar laminectomy, hemi facetectomy and foraminotomies L3-4 and posterior lumbar interbody fusion L3-4 due to severe, and is located at the across the lower back and radiates to legs.   Clinical Impression   This 42 yo male admitted and underwent above presents to acute OT with all education completed and post op back handout provided. Pt without further questions, we will D/C from acute OT.      Recommendations for follow up therapy are one component of a multi-disciplinary discharge planning process, led by the attending physician.  Recommendations may be updated based on patient status, additional functional criteria and insurance authorization.   Assistance Recommended at Discharge PRN  Patient can return home with the following Assistance with cooking/housework;Assist for transportation    Functional Status Assessment  Patient has had a recent decline in their functional status and demonstrates the ability to make significant improvements in function in a reasonable and predictable amount of time. (without further need for OT, all education completed)  Equipment Recommendations  None recommended by OT       Precautions / Restrictions Precautions Precautions: Back Precaution Booklet Issued: Yes (comment) Precaution Comments: brace at home Required Braces or Orthoses: Spinal Brace Spinal Brace: Applied in sitting position;Applied in standing position Restrictions Weight Bearing Restrictions: No      Mobility Bed Mobility               General bed mobility comments: pt sitting on EOB upon arrivval    Transfers Overall transfer level: Modified independent Equipment used: None Transfers: Sit to/from Stand Sit to Stand: Modified independent (Device/Increase time)                   Balance Overall balance assessment: Mild deficits observed, not formally tested                                         ADL either performed or assessed with clinical judgement   ADL Overall ADL's : Modified independent                                       General ADL Comments: Educated on back precautions, positioning for LBD, sequence of dressing, tub transfers (side step), toilet transfers with stance that allows his back to be more straight, use of pillows when in bed, 2 cups for brushing teeth to avoid bending over the sink, use of wet wipes for back peri care to avoid as much bending.     Vision Patient Visual Report: No change from baseline              Pertinent Vitals/Pain Pain Assessment Pain Assessment: 0-10 Pain Score: 8  Pain Location: incisional Pain Descriptors / Indicators: Aching, Sore, Throbbing Pain Intervention(s): Limited activity within patient's tolerance, Monitored during session, Repositioned (not time for pain meds yet)     Hand Dominance Right   Extremity/Trunk Assessment Upper Extremity Assessment Upper Extremity Assessment: Overall WFL for tasks assessed           Communication Communication Communication: No difficulties   Cognition Arousal/Alertness: Awake/alert Behavior During Therapy: WFL for tasks assessed/performed Overall Cognitive Status: Within  Functional Limits for tasks assessed                                                  Home Living Family/patient expects to be discharged to:: Private residence Living Arrangements: Parent Available Help at Discharge: Family;Available 24 hours/day Type of Home: Mobile home Home Access: Stairs to enter Entrance Stairs-Number of Steps: 1 Entrance Stairs-Rails: None Home Layout: One level     Bathroom Shower/Tub: IT trainer: Standard     Home Equipment: None           Prior Functioning/Environment Prior Level of Function : Independent/Modified Independent                        OT Problem List: Decreased range of motion;Pain         OT Goals(Current goals can be found in the care plan section) Acute Rehab OT Goals Patient Stated Goal: to go home today OT Goal Formulation: With patient         AM-PAC OT "6 Clicks" Daily Activity     Outcome Measure Help from another person eating meals?: None Help from another person taking care of personal grooming?: None Help from another person toileting, which includes using toliet, bedpan, or urinal?: None Help from another person bathing (including washing, rinsing, drying)?: None Help from another person to put on and taking off regular upper body clothing?: None Help from another person to put on and taking off regular lower body clothing?: None 6 Click Score: 24   End of Session Equipment Utilized During Treatment:  (none) Nurse Communication:  (no further OT needs)  Activity Tolerance: Patient tolerated treatment well Patient left:  (sitting EOB)  OT Visit Diagnosis: Other abnormalities of gait and mobility (R26.89);Pain Pain - part of body:  (incisional)                Time: 1191-4782 OT Time Calculation (min): 20 min Charges:  OT General Charges $OT Visit: 1 Visit OT Evaluation $OT Eval Moderate Complexity: 1 Mod  Cathy L. OT Acute Rehabilitation Services Office [phone]    Evette Georges 07/25/2022, 7:54 AM

## 2022-07-25 NOTE — Anesthesia Postprocedure Evaluation (Signed)
Anesthesia Post Note  Patient: [name]  Procedure(s) Performed: LUMBAR THREE-FOUR POSTERIOR LUMBAR INTERBODY FUSION WITH REMOVAL OF LUMBAR FOUR-SACRAL ONE HARDWARE (Spine Lumbar)     Patient location during evaluation: PACU Anesthesia Type: General Level of consciousness: awake and alert, oriented and patient cooperative Pain management: pain level controlled Vital Signs Assessment: post-procedure vital signs reviewed and stable Respiratory status: spontaneous breathing, nonlabored ventilation and respiratory function stable Cardiovascular status: blood pressure returned to baseline and stable Postop Assessment: no apparent nausea or vomiting Anesthetic complications: no   No notable events documented.  Last Vitals:  Vitals:   07/25/22 0420 07/25/22 0723  BP: 138/86 135/86  Pulse: 96 (!) 56  Resp: 18 18  Temp: 37 C 36.5 C  SpO2: 94% 96%    Last Pain:  Vitals:   07/25/22 0815  TempSrc:   PainSc: 7                  Lannie Fields

## 2022-07-25 NOTE — Discharge Summary (Addendum)
Physician Discharge Summary  Patient ID: Manuel Allen MRN: [id number] DOB/AGE: 1980/11/20 42 y.o.  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 The patient tolerated the procedure well and was taken to the recovery room and then to the floor in stable condition. The hospital course was routine. There were no complications. The wound remained clean dry and intact. Pt had appropriate back soreness. No complaints of leg pain or new N/T/W. The patient remained afebrile with stable vital signs, and tolerated a regular diet. The patient continued to increase activities, and pain was well controlled with oral pain medications.   Consults: None  Significant Diagnostic Studies:  Results for orders placed or performed during the hospital encounter of 07/15/22  Surgical pcr screen   Specimen: Nasal Mucosa; Nasal Swab  Result Value Ref Range   MRSA, PCR NEGATIVE NEGATIVE   Staphylococcus aureus NEGATIVE NEGATIVE  Protime-INR  Result Value Ref Range   Prothrombin Time 12.6 11.4 - 15.2 seconds   INR 1.0 0.8 - 1.2  Basic metabolic panel per protocol  Result Value Ref Range   Sodium 136 135 - 145 mmol/L   Potassium 3.4 (L) 3.5 - 5.1 mmol/L   Chloride 102 98 - 111 mmol/L   CO2 26 22 - 32 mmol/L   Glucose, Bld 122 (H) 70 - 99 mg/dL   BUN 11 6 - 20 mg/dL   Creatinine, Ser 7.821.13 0.61 - 1.24 mg/dL   Calcium 9.2 8.9 - 95.610.3 mg/dL   GFR, Estimated >21>60 >30>60 mL/min   Anion gap 8 5 - 15  CBC per protocol  Result Value Ref Range   WBC 8.2 4.0 - 10.5 K/uL   RBC 4.98 4.22 - 5.81 MIL/uL   Hemoglobin 14.7 13.0 - 17.0 g/dL   HCT 86.544.2 78.439.0 - 69.652.0 %    MCV 88.8 80.0 - 100.0 fL   MCH 29.5 26.0 - 34.0 pg   MCHC 33.3 30.0 - 36.0 g/dL   RDW 29.513.2 28.411.5 - 13.215.5 %   Platelets 168 150 - 400 K/uL   nRBC 0.0 0.0 - 0.2 %  Type and screen [facility]  Result Value Ref Range   ABO/RH(D) O POS    Antibody Screen NEG    Sample Expiration 07/29/2022,2359    Extend sample reason      NO TRANSFUSIONS OR PREGNANCY IN THE PAST 3 MONTHS Performed at [facility], [address] [place], [place] [address]     DG Lumbar Spine 2-3 Views  Result Date: 07/24/2022 CLINICAL DATA:  Elective surgery EXAM: LUMBAR SPINE - 2-3 VIEW COMPARISON:  Lumbar spine x-ray 09/30/2016 FINDINGS: Two intraoperative fluoroscopic views of the lumbar spine were obtained. L4, L5-S1 posterior fusion hardware is present. Disc spacers seen at L4-L5. Anterior fusion plate seen at L5 S1. Alignment is grossly anatomic. Fluoroscopy time 43 seconds. Fluoroscopy dose 50.63 micro gray. IMPRESSION: L4, L5-S1 posterior fusion and L5-S1 anterior fusion. Electronically Signed   By: Darliss CheneyAmy  Guttmann M.D.   On: 07/24/2022 15:04   DG C-Arm 1-60 Min-No Report  Result Date: 07/24/2022 Fluoroscopy was utilized by the requesting physician.  No radiographic interpretation.   DG C-Arm 1-60  Min-No Report  Result Date: 07/24/2022 Fluoroscopy was utilized by the requesting physician.  No radiographic interpretation.   DG C-Arm 1-60 Min-No Report  Result Date: 07/24/2022 Fluoroscopy was utilized by the requesting physician.  No radiographic interpretation.   MR LUMBAR SPINE WO CONTRAST  Result Date: 06/27/2022 CLINICAL DATA:  Low back pain and difficulty walking. History of prior fusion. EXAM: MRI LUMBAR SPINE WITHOUT CONTRAST TECHNIQUE: Multiplanar, multisequence MR imaging of the lumbar spine was performed. No intravenous contrast was administered. COMPARISON:  MRI lumbar spine dated December 13, 2020. FINDINGS: Segmentation:  Standard. Alignment:  No significant listhesis.  Vertebrae: No fracture, evidence of discitis, or bone lesion. Prior L4-S1 anterior, posterior, and interbody fusion. Conus medullaris and cauda equina: Conus extends to the T12-L1 level. Conus and cauda equina appear normal. Paraspinal and other soft tissues: Negative. Disc levels: T12-L1:  Unchanged minimal disc bulging.  No stenosis. L1-L2: Unchanged tiny shallow left foraminal disc protrusion. No stenosis. L2-L3: Unchanged mild disc bulging with superimposed left foraminal and far lateral disc protrusion. Unchanged moderate left facet arthropathy. Unchanged mild spinal canal stenosis. Resolved left lateral recess stenosis. Unchanged moderate left and mild right neuroforaminal stenosis. L3-L4: Slightly progressive left eccentric disc bulging with broad-based left subarticular and foraminal disc protrusion. Progressive moderate left and mild right facet arthropathy. Progressive severe spinal canal stenosis. Unchanged moderate left and mild right neuroforaminal stenosis. L4-L5: Prior posterior decompression and fusion. No residual stenosis. L5-S1: Prior posterior decompression and fusion. Unchanged mild right lateral recess stenosis due to endplate spurring. No residual spinal canal or neuroforaminal stenosis. IMPRESSION: 1. Prior L4-S1 fusion with progressive adjacent segment disease at L3-L4 resulting in progressive severe spinal canal stenosis. 2. Unchanged mild spinal canal stenosis and moderate left neuroforaminal stenosis at L2-L3. Electronically Signed   By: Obie Dredge M.D.   On: 06/27/2022 12:29    Antibiotics:  Anti-infectives (From admission, onward)    Start     Dose/Rate Route Frequency Ordered Stop   07/24/22 2200  minocycline (MINOCIN) capsule 100 mg        100 mg Oral 2 times daily 07/24/22 1649     07/24/22 2000  ceFAZolin (ANCEF) IVPB 2g/100 mL premix        2 g 200 mL/hr over 30 Minutes Intravenous Every 8 hours 07/24/22 1701 07/25/22 0458   07/24/22 1030  ceFAZolin (ANCEF) IVPB  3g/100 mL premix  Status:  Discontinued        3 g 200 mL/hr over 30 Minutes Intravenous On call to O.R. 07/24/22 1022 07/24/22 1649   07/24/22 1026  ceFAZolin (ANCEF) 2-4 GM/100ML-% IVPB  Status:  Discontinued       Note to Pharmacy: Payton Emerald A: cabinet override      07/24/22 1026 07/24/22 1043       Discharge Exam: Blood pressure 135/86, pulse (!) 56, temperature 97.7 F (36.5 C), temperature source Oral, resp. rate 18, height 5\' 9"  (1.753 m), weight 122.5 kg, SpO2 96 %. Neurologic: Grossly normal Ambulating and voiding well incision cdi   Discharge Medications:   Allergies as of 07/25/2022       Reactions   Tramadol Other (See Comments), Nausea And Vomiting   Codeine Hives        Medication List     TAKE these medications    allopurinol 300 MG tablet Commonly known as: ZYLOPRIM Take 300 mg by mouth daily.   buPROPion 150 MG 24 hr tablet Commonly known as: WELLBUTRIN XL Take 150 mg by mouth daily.  cyclobenzaprine 10 MG tablet Commonly known as: FLEXERIL Take 1 tablet (10 mg total) by mouth 3 (three) times daily.   dexamethasone 4 MG tablet Commonly known as: DECADRON Take 1 tablet (4 mg total) by mouth 2 (two) times daily with a meal.   diclofenac 75 MG EC tablet Commonly known as: VOLTAREN Take 1 tablet (75 mg total) by mouth 2 (two) times daily.   famotidine 20 MG tablet Commonly known as: PEPCID Take 20 mg by mouth 2 (two) times daily.   HYDROcodone-acetaminophen 5-325 MG tablet Commonly known as: NORCO/VICODIN Take one-two tabs po q 4-6 hrs prn pain   methocarbamol 500 MG tablet Commonly known as: ROBAXIN Take 1 tablet (500 mg total) by mouth 3 (three) times daily.   minocycline 100 MG capsule Commonly known as: MINOCIN Take 100 mg by mouth 2 (two) times daily.   omeprazole 40 MG capsule Commonly known as: PRILOSEC Take 40 mg by mouth daily.   oxyCODONE 15 MG immediate release tablet Commonly known as: ROXICODONE Take 1 tablet (15  mg total) by mouth every 4 (four) hours as needed for pain. What changed: when to take this   pregabalin 100 MG capsule Commonly known as: LYRICA Take 100 mg by mouth 3 (three) times daily.   VITAMIN D3 PO Take 1 tablet by mouth daily.               Durable Medical Equipment  (From admission, onward)           Start     Ordered   07/24/22 1702  DME Walker rolling  Once       Question:  Patient needs a walker to treat with the following condition  Answer:  S/P lumbar fusion   07/24/22 1701   07/24/22 1702  DME 3 n 1  Once        07/24/22 1701            Disposition: home   Final Dx: PLIF L3-4 removal of hardwaqre L5-S1  Discharge Instructions      Remove dressing in 72 hours   Complete by: As directed    Call MD for:  difficulty breathing, headache or visual disturbances   Complete by: As directed    Call MD for:  extreme fatigue   Complete by: As directed    Call MD for:  hives   Complete by: As directed    Call MD for:  persistant dizziness or light-headedness   Complete by: As directed    Call MD for:  persistant nausea and vomiting   Complete by: As directed    Call MD for:  redness, tenderness, or signs of infection (pain, swelling, redness, odor or green/yellow discharge around incision site)   Complete by: As directed    Call MD for:  severe uncontrolled pain   Complete by: As directed    Call MD for:  temperature >100.4   Complete by: As directed    Diet - low sodium heart healthy   Complete by: As directed    Driving Restrictions   Complete by: As directed    No driving for 2 weeks, no riding in the car for 1 week   Increase activity slowly   Complete by: As directed    Lifting restrictions   Complete by: As directed    No lifting more than 8 lbs          Signed: Tiana Loft Maybelle Depaoli 07/25/2022, 8:01 AM

## 2022-07-25 NOTE — Plan of Care (Signed)
Pt doing well. Pt and family given D/C instructions with verbal understanding. Rx's were sent to the pharmacy by MD. Pt's incision is clean and dry with no sign of infection. Pt's IV was removed prior to D/C. Pt D/C'd home via wheelchair per MD order. Pt is stable @ D/C and has no other needs at this time. Aycen Porreca, RN  

## 2022-07-25 NOTE — Evaluation (Signed)
Physical Therapy Evaluation Patient Details Name: Manuel Allen MRN: [id number] DOB: 1981/03/09 Today's Date: 07/25/2022  History of Present Illness  [name] is s/p decompressive lumbar laminectomy, hemi facetectomy and foraminotomies L3-4 and posterior lumbar interbody fusion L3-4 due to severe, and is located at the across the lower back and radiates to legs.  Clinical Impression  Pt admitted with above. Pt with good understanding of back precautions. Pt functioning modI/supervision level without AD. Pt with no further acute PT needs at this time. Please re-consult if needed in future.       Recommendations for follow up therapy are one component of a multi-disciplinary discharge planning process, led by the attending physician.  Recommendations may be updated based on patient status, additional functional criteria and insurance authorization.  Follow Up Recommendations       Assistance Recommended at Discharge PRN  Patient can return home with the following       Equipment Recommendations None recommended by PT  Recommendations for Other Services       Functional Status Assessment Patient has had a recent decline in their functional status and demonstrates the ability to make significant improvements in function in a reasonable and predictable amount of time.     Precautions / Restrictions Precautions Precautions: Back Precaution Booklet Issued: Yes (comment) Precaution Comments: brace at home Required Braces or Orthoses: Spinal Brace Spinal Brace: Applied in sitting position;Applied in standing position Restrictions Weight Bearing Restrictions: No      Mobility  Bed Mobility Overal bed mobility: Modified Independent             General bed mobility comments: verbal cues for log roll technique, educated on benefit of sleeping with pillow between knees on the side, once pillow placed between knees pt agreed feeling better    Transfers Overall transfer level:  Modified independent Equipment used: None Transfers: Sit to/from Stand Sit to Stand: Modified independent (Device/Increase time)           General transfer comment: slow and guarded    Ambulation/Gait Ambulation/Gait assistance: Supervision Gait Distance (Feet): 200 Feet Assistive device: None Gait Pattern/deviations: Step-through pattern, Decreased stride length Gait velocity: slow Gait velocity interpretation: 1.31 - 2.62 ft/sec, indicative of limited community ambulator   General Gait Details: pt with mild R LE limp, R shld elevated  Stairs Stairs: Yes Stairs assistance: Min guard Stair Management: One rail Right, Step to pattern, Forwards Number of Stairs: 4 General stair comments: educated on "up with the good (L), down with the bad (R)"  Wheelchair Mobility    Modified Rankin (Stroke Patients Only)       Balance Overall balance assessment: Mild deficits observed, not formally tested                                           Pertinent Vitals/Pain Pain Assessment Pain Assessment: 0-10 Pain Score: 6  Pain Location: incisional Pain Descriptors / Indicators: Aching, Sore, Throbbing Pain Intervention(s): Monitored during session    Home Living Family/patient expects to be discharged to:: Private residence Living Arrangements: Parent Available Help at Discharge: Family;Available 24 hours/day Type of Home: Mobile home Home Access: Stairs to enter Entrance Stairs-Rails: None Entrance Stairs-Number of Steps: 1   Home Layout: One level Home Equipment: None      Prior Function Prior Level of Function : Independent/Modified Independent  Hand Dominance   Dominant Hand: Right    Extremity/Trunk Assessment   Upper Extremity Assessment Upper Extremity Assessment: Overall WFL for tasks assessed    Lower Extremity Assessment Lower Extremity Assessment: Overall WFL for tasks assessed    Cervical / Trunk  Assessment Cervical / Trunk Assessment: Back Surgery  Communication   Communication: No difficulties  Cognition Arousal/Alertness: Awake/alert Behavior During Therapy: WFL for tasks assessed/performed Overall Cognitive Status: Within Functional Limits for tasks assessed                                          General Comments General comments (skin integrity, edema, etc.): incision dressing intact    Exercises     Assessment/Plan    PT Assessment Patient does not need any further PT services  PT Problem List         PT Treatment Interventions      PT Goals (Current goals can be found in the Care Plan section)  Acute Rehab PT Goals Patient Stated Goal: home today PT Goal Formulation: All assessment and education complete, DC therapy    Frequency       Co-evaluation               AM-PAC PT "6 Clicks" Mobility  Outcome Measure Help needed turning from your back to your side while in a flat bed without using bedrails?: None Help needed moving from lying on your back to sitting on the side of a flat bed without using bedrails?: None Help needed moving to and from a bed to a chair (including a wheelchair)?: None Help needed standing up from a chair using your arms (e.g., wheelchair or bedside chair)?: None Help needed to walk in hospital room?: A Little Help needed climbing 3-5 steps with a railing? : A Little 6 Click Score: 22    End of Session   Activity Tolerance: Patient tolerated treatment well Patient left: in bed;with call bell/phone within reach;with family/visitor present Nurse Communication: Mobility status PT Visit Diagnosis: Difficulty in walking, not elsewhere classified (R26.2)    Time: 6568-1275 PT Time Calculation (min) (ACUTE ONLY): 28 min   Charges:   PT Evaluation $PT Eval Low Complexity: 1 Low PT Treatments $Gait Training: 8-22 mins        Lewis Shock, PT, DPT Acute Rehabilitation Services Secure chat  preferred Office #: [phone]   Iona Hansen 07/25/2022, 8:33 AM

## 2023-09-09 ENCOUNTER — Other Ambulatory Visit (HOSPITAL_COMMUNITY): Payer: Self-pay | Admitting: Neurological Surgery

## 2023-09-09 DIAGNOSIS — M5416 Radiculopathy, lumbar region: Secondary | ICD-10-CM

## 2023-09-23 ENCOUNTER — Ambulatory Visit (HOSPITAL_COMMUNITY)
Admission: RE | Admit: 2023-09-23 | Discharge: 2023-09-23 | Disposition: A | Discharge status: Home / Self Care | Source: Ambulatory Visit | Attending: Neurological Surgery | Admitting: Neurological Surgery

## 2023-09-23 DIAGNOSIS — M5416 Radiculopathy, lumbar region: Secondary | ICD-10-CM | POA: Diagnosis present

## 2023-10-16 ENCOUNTER — Other Ambulatory Visit: Payer: Self-pay | Admitting: Neurological Surgery

## 2023-12-25 NOTE — Progress Notes (Signed)
 Surgical Instructions   Your procedure is scheduled on Wednesday, September 17th, 2025. Report to [facility] Main Entrance A at 6:30 A.M., then check in with the Admitting office. Any questions or running late day of surgery: call [phone]  Questions prior to your surgery date: call [phone], Monday-Friday, 8am-4pm. If you experience any cold or flu symptoms such as cough, fever, chills, shortness of breath, etc. between now and your scheduled surgery, please notify us  at the above number.     Remember:  Do not eat or drink after midnight the night before your surgery    Take these medicines the morning of surgery with A SIP OF WATER: Bupropion  (Zyban ) Pregabalin  (Lyrica )   May take these medicines IF NEEDED: Oxycodone  (Roxicodone )    One week prior to surgery, STOP taking any Aspirin (unless otherwise instructed by your surgeon) Aleve , Naproxen , Ibuprofen , Motrin , Advil , Goody's, BC's, all herbal medications, fish oil, and non-prescription vitamins.                     Do NOT Smoke (Tobacco/Vaping) for 24 hours prior to your procedure.  If you use a CPAP at night, you may bring your mask/headgear for your overnight stay.   You will be asked to remove any contacts, glasses, piercing's, hearing aid's, dentures/partials prior to surgery. Please bring cases for these items if needed.    Patients discharged the day of surgery will not be allowed to drive home, and someone needs to stay with them for 24 hours.  SURGICAL WAITING ROOM VISITATION Patients may have no more than 2 support people in the waiting area - these visitors may rotate.   Pre-op nurse will coordinate an appropriate time for 1 ADULT support person, who may not rotate, to accompany patient in pre-op.  Children under the age of 43 must have an adult with them who is not the patient and must remain in the main waiting area with an adult.  If the patient needs to stay at the hospital during part of their  recovery, the visitor guidelines for inpatient rooms apply.  Please refer to the [facility] website for the visitor guidelines for any additional information.   If you received a COVID test during your pre-op visit  it is requested that you wear a mask when out in public, stay away from anyone that may not be feeling well and notify your surgeon if you develop symptoms. If you have been in contact with anyone that has tested positive in the last 10 days please notify you surgeon.      Pre-operative 5 CHG Bathing Instructions   You can play a key role in reducing the risk of infection after surgery. Your skin needs to be as free of germs as possible. You can reduce the number of germs on your skin by washing with CHG (chlorhexidine  gluconate) soap before surgery. CHG is an antiseptic soap that kills germs and continues to kill germs even after washing.   DO NOT use if you have an allergy to chlorhexidine /CHG or antibacterial soaps. If your skin becomes reddened or irritated, stop using the CHG and notify one of our RNs at [phone].   Please shower with the CHG soap starting 4 days before surgery using the following schedule:     Please keep in mind the following:  DO NOT shave, including legs and underarms, starting the day of your first shower.   You may shave your face at any point before/day of surgery.  Place clean sheets on your bed the day you start using CHG soap. Use a clean washcloth (not used since being washed) for each shower. DO NOT sleep with pets once you start using the CHG.   CHG Shower Instructions:  Wash your face and private area with normal soap. If you choose to wash your hair, wash first with your normal shampoo.  After you use shampoo/soap, rinse your hair and body thoroughly to remove shampoo/soap residue.  Turn the water OFF and apply about 3 tablespoons (45 ml) of CHG soap to a CLEAN washcloth.  Apply CHG soap ONLY FROM YOUR NECK DOWN TO YOUR TOES  (washing for 3-5 minutes)  DO NOT use CHG soap on face, private areas, open wounds, or sores.  Pay special attention to the area where your surgery is being performed.  If you are having back surgery, having someone wash your back for you may be helpful. Wait 2 minutes after CHG soap is applied, then you may rinse off the CHG soap.  Pat dry with a clean towel  Put on clean clothes/pajamas   If you choose to wear lotion, please use ONLY the CHG-compatible lotions that are listed below.  Additional instructions for the day of surgery: DO NOT APPLY any lotions, deodorants, cologne, or perfumes.   Do not bring valuables to the hospital. [facility] is not responsible for any belongings/valuables. Do not wear nail polish, gel polish, artificial nails, or any other type of covering on natural nails (fingers and toes) Do not wear jewelry or makeup Put on clean/comfortable clothes.  Please brush your teeth.  Ask your nurse before applying any prescription medications to the skin.     CHG Compatible Lotions   Aveeno Moisturizing lotion  Cetaphil Moisturizing Cream  Cetaphil Moisturizing Lotion  Clairol Herbal Essence Moisturizing Lotion, Dry Skin  Clairol Herbal Essence Moisturizing Lotion, Extra Dry Skin  Clairol Herbal Essence Moisturizing Lotion, Normal Skin  Curel Age Defying Therapeutic Moisturizing Lotion with Alpha Hydroxy  Curel Extreme Care Body Lotion  Curel Soothing Hands Moisturizing Hand Lotion  Curel Therapeutic Moisturizing Cream, Fragrance-Free  Curel Therapeutic Moisturizing Lotion, Fragrance-Free  Curel Therapeutic Moisturizing Lotion, Original Formula  Eucerin Daily Replenishing Lotion  Eucerin Dry Skin Therapy Plus Alpha Hydroxy Crme  Eucerin Dry Skin Therapy Plus Alpha Hydroxy Lotion  Eucerin Original Crme  Eucerin Original Lotion  Eucerin Plus Crme Eucerin Plus Lotion  Eucerin TriLipid Replenishing Lotion  Keri Anti-Bacterial Hand Lotion  Keri Deep  Conditioning Original Lotion Dry Skin Formula Softly Scented  Keri Deep Conditioning Original Lotion, Fragrance Free Sensitive Skin Formula  Keri Lotion Fast Absorbing Fragrance Free Sensitive Skin Formula  Keri Lotion Fast Absorbing Softly Scented Dry Skin Formula  Keri Original Lotion  Keri Skin Renewal Lotion Keri Silky Smooth Lotion  Keri Silky Smooth Sensitive Skin Lotion  Nivea Body Creamy Conditioning Oil  Nivea Body Extra Enriched Lotion  Nivea Body Original Lotion  Nivea Body Sheer Moisturizing Lotion Nivea Crme  Nivea Skin Firming Lotion  NutraDerm 30 Skin Lotion  NutraDerm Skin Lotion  NutraDerm Therapeutic Skin Cream  NutraDerm Therapeutic Skin Lotion  ProShield Protective Hand Cream  Provon moisturizing lotion  Please read over the following fact sheets that you were given.

## 2023-12-26 ENCOUNTER — Encounter (HOSPITAL_COMMUNITY)
Admission: RE | Admit: 2023-12-26 | Discharge: 2023-12-26 | Disposition: A | Discharge status: Home / Self Care | Source: Ambulatory Visit | Attending: Neurological Surgery | Admitting: Neurological Surgery

## 2023-12-26 ENCOUNTER — Other Ambulatory Visit: Payer: Self-pay

## 2023-12-26 ENCOUNTER — Encounter (HOSPITAL_COMMUNITY): Payer: Self-pay

## 2023-12-26 VITALS — BP 133/97 | HR 84 | Temp 98.2°F | Resp 18 | Ht 64.0 in | Wt 260.7 lb

## 2023-12-26 DIAGNOSIS — Z01812 Encounter for preprocedural laboratory examination: Secondary | ICD-10-CM | POA: Insufficient documentation

## 2023-12-26 DIAGNOSIS — D649 Anemia, unspecified: Secondary | ICD-10-CM | POA: Insufficient documentation

## 2023-12-26 DIAGNOSIS — Z01818 Encounter for other preprocedural examination: Secondary | ICD-10-CM

## 2023-12-26 LAB — BASIC METABOLIC PANEL WITH GFR
Anion gap: 10 (ref 5–15)
BUN: 7 mg/dL (ref 6–20)
CO2: 24 mmol/L (ref 22–32)
Calcium: 9.1 mg/dL (ref 8.9–10.3)
Chloride: 105 mmol/L (ref 98–111)
Creatinine, Ser: 0.91 mg/dL (ref 0.61–1.24)
GFR, Estimated: >60 mL/min (ref >60)
Glucose, Bld: 91 mg/dL (ref 70–99)
Potassium: 4.1 mmol/L (ref 3.5–5.1)
Sodium: 139 mmol/L (ref 135–145)

## 2023-12-26 LAB — SURGICAL PCR SCREEN
MRSA, PCR: NEGATIVE
Staphylococcus aureus: NEGATIVE

## 2023-12-26 LAB — CBC
HCT: 46 % (ref 39.0–52.0)
Hemoglobin: 14.8 g/dL (ref 13.0–17.0)
MCH: 29.1 pg (ref 26.0–34.0)
MCHC: 32.2 g/dL (ref 30.0–36.0)
MCV: 90.4 fL (ref 80.0–100.0)
Platelets: 185 K/uL (ref 150–400)
RBC: 5.09 MIL/uL (ref 4.22–5.81)
RDW: 13.6 % (ref 11.5–15.5)
WBC: 10.2 K/uL (ref 4.0–10.5)
nRBC: 0 % (ref 0.0–0.2)

## 2023-12-26 LAB — TYPE AND SCREEN
ABO/RH(D): O POS
Antibody Screen: NEGATIVE

## 2023-12-26 NOTE — Progress Notes (Signed)
 PCP - Mady Kirsch, NP Cardiologist -   PPM/ICD - denies Device Orders - na Rep Notified - na  Chest x-ray - na EKG - na Stress Test -  ECHO -  Cardiac Cath -   Sleep Study - denies CPAP - na  Non-diabetic  Blood Thinner Instructions: denies Aspirin Instructions:denies  ERAS Protcol -NPO  Anesthesia review: No  Patient denies shortness of breath, fever, cough and chest pain at PAT appointment   All instructions explained to the patient, with a verbal understanding of the material. Patient agrees to go over the instructions while at home for a better understanding. Patient also instructed to self quarantine after being tested for COVID-19. The opportunity to ask questions was provided.

## 2023-12-30 NOTE — Progress Notes (Signed)
 Called and spoke with patient's mother, Huy Majid, and informed her the patient is to arrive at 1030 for a 1228 procedure start time.

## 2023-12-31 ENCOUNTER — Encounter (HOSPITAL_COMMUNITY): Payer: Self-pay | Admitting: Neurological Surgery

## 2023-12-31 ENCOUNTER — Encounter (HOSPITAL_COMMUNITY)
Admission: RE | Disposition: A | Discharge status: Home / Self Care | Payer: Self-pay | Source: Home / Self Care | Attending: Neurological Surgery

## 2023-12-31 ENCOUNTER — Other Ambulatory Visit: Payer: Self-pay

## 2023-12-31 ENCOUNTER — Observation Stay (HOSPITAL_COMMUNITY)
Admission: RE | Admit: 2023-12-31 | Discharge: 2024-01-01 | Disposition: A | Discharge status: Home / Self Care | Attending: Neurological Surgery | Admitting: Neurological Surgery

## 2023-12-31 ENCOUNTER — Ambulatory Visit (HOSPITAL_COMMUNITY): Payer: Self-pay | Admitting: Vascular Surgery

## 2023-12-31 ENCOUNTER — Ambulatory Visit (HOSPITAL_COMMUNITY)

## 2023-12-31 ENCOUNTER — Ambulatory Visit (HOSPITAL_COMMUNITY): Admitting: Anesthesiology

## 2023-12-31 DIAGNOSIS — Z87891 Personal history of nicotine dependence: Secondary | ICD-10-CM | POA: Diagnosis not present

## 2023-12-31 DIAGNOSIS — M48061 Spinal stenosis, lumbar region without neurogenic claudication: Secondary | ICD-10-CM | POA: Diagnosis not present

## 2023-12-31 DIAGNOSIS — M5416 Radiculopathy, lumbar region: Secondary | ICD-10-CM | POA: Insufficient documentation

## 2023-12-31 DIAGNOSIS — M5440 Lumbago with sciatica, unspecified side: Secondary | ICD-10-CM | POA: Diagnosis present

## 2023-12-31 DIAGNOSIS — Z981 Arthrodesis status: Principal | ICD-10-CM

## 2023-12-31 SURGERY — POSTERIOR LUMBAR FUSION 1 LEVEL
Anesthesia: General | Site: Back

## 2023-12-31 MED ORDER — SODIUM CHLORIDE 0.9% FLUSH
3.0000 mL | Freq: Two times a day (BID) | INTRAVENOUS | Status: DC
  Administered 2023-12-31 – 2024-01-01 (×2): 3 mL via INTRAVENOUS

## 2023-12-31 MED ORDER — HYDROMORPHONE HCL 1 MG/ML IJ SOLN: 0.5000 mg | INTRAMUSCULAR | Status: DC | PRN

## 2023-12-31 MED ORDER — FENTANYL CITRATE (PF) 100 MCG/2ML IJ SOLN
25.0000 ug | INTRAMUSCULAR | Status: DC | PRN
  Administered 2023-12-31 (×3): 50 ug via INTRAVENOUS

## 2023-12-31 MED ORDER — DIPHENHYDRAMINE HCL 50 MG/ML IJ SOLN
INTRAMUSCULAR | Status: AC
  Filled 2023-12-31: qty 1

## 2023-12-31 MED ORDER — LACTATED RINGERS IV SOLN: INTRAVENOUS | Status: DC

## 2023-12-31 MED ORDER — PROPOFOL 10 MG/ML IV BOLUS
INTRAVENOUS | Status: AC
  Filled 2023-12-31: qty 20

## 2023-12-31 MED ORDER — OXYCODONE HCL 5 MG/5ML PO SOLN: 5.0000 mg | Freq: Once | ORAL | Status: DC | PRN

## 2023-12-31 MED ORDER — CEFAZOLIN SODIUM-DEXTROSE 2-4 GM/100ML-% IV SOLN
2.0000 g | INTRAVENOUS | Status: AC
Start: 2023-12-31 — End: 2023-12-31
  Administered 2023-12-31: 2 g via INTRAVENOUS
  Filled 2023-12-31: qty 100

## 2023-12-31 MED ORDER — GABAPENTIN 300 MG PO CAPS
300.0000 mg | ORAL_CAPSULE | ORAL | Status: AC
  Administered 2023-12-31: 300 mg via ORAL
  Filled 2023-12-31: qty 1

## 2023-12-31 MED ORDER — ONDANSETRON HCL 4 MG PO TABS: 4.0000 mg | ORAL_TABLET | Freq: Four times a day (QID) | ORAL | Status: DC | PRN

## 2023-12-31 MED ORDER — METHOCARBAMOL 1000 MG/10ML IJ SOLN: 500.0000 mg | Freq: Four times a day (QID) | INTRAMUSCULAR | Status: DC | PRN

## 2023-12-31 MED ORDER — THROMBIN 5000 UNITS EX SOLR
OROMUCOSAL | Status: DC | PRN
  Administered 2023-12-31: 5 mL via TOPICAL

## 2023-12-31 MED ORDER — FENTANYL CITRATE (PF) 250 MCG/5ML IJ SOLN
INTRAMUSCULAR | Status: AC
  Filled 2023-12-31: qty 5

## 2023-12-31 MED ORDER — LIDOCAINE 2% (20 MG/ML) 5 ML SYRINGE
INTRAMUSCULAR | Status: AC
Start: 2023-12-31 — End: 2023-12-31
  Filled 2023-12-31: qty 10

## 2023-12-31 MED ORDER — ORAL CARE MOUTH RINSE: 15.0000 mL | Freq: Once | OROMUCOSAL | Status: AC

## 2023-12-31 MED ORDER — KETAMINE HCL 50 MG/5ML IJ SOSY
PREFILLED_SYRINGE | INTRAMUSCULAR | Status: DC | PRN
  Administered 2023-12-31: 20 mg via INTRAVENOUS
  Administered 2023-12-31: 30 mg via INTRAVENOUS

## 2023-12-31 MED ORDER — MIDAZOLAM HCL 2 MG/2ML IJ SOLN
INTRAMUSCULAR | Status: DC | PRN
  Administered 2023-12-31: 2 mg via INTRAVENOUS

## 2023-12-31 MED ORDER — SODIUM CHLORIDE 0.9% FLUSH: 3.0000 mL | INTRAVENOUS | Status: DC | PRN

## 2023-12-31 MED ORDER — 0.9 % SODIUM CHLORIDE (POUR BTL) OPTIME
TOPICAL | Status: DC | PRN
  Administered 2023-12-31: 1000 mL

## 2023-12-31 MED ORDER — METHOCARBAMOL 500 MG PO TABS
500.0000 mg | ORAL_TABLET | Freq: Four times a day (QID) | ORAL | Status: DC | PRN
  Administered 2023-12-31 – 2024-01-01 (×2): 500 mg via ORAL
  Filled 2023-12-31 (×2): qty 1

## 2023-12-31 MED ORDER — ROCURONIUM BROMIDE 10 MG/ML (PF) SYRINGE
PREFILLED_SYRINGE | INTRAVENOUS | Status: AC
  Filled 2023-12-31: qty 40

## 2023-12-31 MED ORDER — SODIUM CHLORIDE 0.9 % IV SOLN: 250.0000 mL | INTRAVENOUS | Status: DC

## 2023-12-31 MED ORDER — LIDOCAINE 2% (20 MG/ML) 5 ML SYRINGE
INTRAMUSCULAR | Status: DC | PRN
  Administered 2023-12-31: 60 mg via INTRAVENOUS

## 2023-12-31 MED ORDER — SUGAMMADEX SODIUM 200 MG/2ML IV SOLN
INTRAVENOUS | Status: DC | PRN
  Administered 2023-12-31: 400 mg via INTRAVENOUS

## 2023-12-31 MED ORDER — PROPOFOL 10 MG/ML IV BOLUS
INTRAVENOUS | Status: DC | PRN
  Administered 2023-12-31: 200 mg via INTRAVENOUS

## 2023-12-31 MED ORDER — OXYCODONE HCL 5 MG PO TABS
15.0000 mg | ORAL_TABLET | ORAL | Status: DC | PRN
  Administered 2023-12-31 – 2024-01-01 (×4): 15 mg via ORAL
  Filled 2023-12-31 (×4): qty 3

## 2023-12-31 MED ORDER — PREGABALIN 100 MG PO CAPS
100.0000 mg | ORAL_CAPSULE | Freq: Three times a day (TID) | ORAL | Status: DC
  Administered 2023-12-31 – 2024-01-01 (×3): 100 mg via ORAL
  Filled 2023-12-31 (×3): qty 1

## 2023-12-31 MED ORDER — PHENOL 1.4 % MT LIQD: 1.0000 | OROMUCOSAL | Status: DC | PRN

## 2023-12-31 MED ORDER — CHLORHEXIDINE GLUCONATE 0.12 % MT SOLN
15.0000 mL | Freq: Once | OROMUCOSAL | Status: AC
  Administered 2023-12-31: 15 mL via OROMUCOSAL
  Filled 2023-12-31: qty 15

## 2023-12-31 MED ORDER — DEXMEDETOMIDINE HCL IN NACL 80 MCG/20ML IV SOLN
INTRAVENOUS | Status: AC
  Filled 2023-12-31: qty 20

## 2023-12-31 MED ORDER — DEXAMETHASONE SODIUM PHOSPHATE 10 MG/ML IJ SOLN
INTRAMUSCULAR | Status: DC | PRN
  Administered 2023-12-31: 10 mg via INTRAVENOUS

## 2023-12-31 MED ORDER — ROCURONIUM BROMIDE 10 MG/ML (PF) SYRINGE
PREFILLED_SYRINGE | INTRAVENOUS | Status: DC | PRN
Start: 2023-12-31 — End: 2023-12-31
  Administered 2023-12-31 (×3): 50 mg via INTRAVENOUS

## 2023-12-31 MED ORDER — ONDANSETRON HCL 4 MG/2ML IJ SOLN
INTRAMUSCULAR | Status: DC | PRN
  Administered 2023-12-31: 4 mg via INTRAVENOUS

## 2023-12-31 MED ORDER — PROPOFOL 500 MG/50ML IV EMUL
INTRAVENOUS | Status: DC | PRN
  Administered 2023-12-31: 100 ug/kg/min via INTRAVENOUS

## 2023-12-31 MED ORDER — ONDANSETRON HCL 4 MG/2ML IJ SOLN: 4.0000 mg | Freq: Four times a day (QID) | INTRAMUSCULAR | Status: DC | PRN

## 2023-12-31 MED ORDER — THROMBIN 5000 UNITS EX KIT
PACK | CUTANEOUS | Status: AC
  Filled 2023-12-31: qty 1

## 2023-12-31 MED ORDER — BUPROPION HCL ER (SR) 150 MG PO TB12
150.0000 mg | ORAL_TABLET | Freq: Two times a day (BID) | ORAL | Status: DC
  Administered 2023-12-31 – 2024-01-01 (×2): 150 mg via ORAL
  Filled 2023-12-31 (×2): qty 1

## 2023-12-31 MED ORDER — DEXAMETHASONE SODIUM PHOSPHATE 10 MG/ML IJ SOLN
INTRAMUSCULAR | Status: AC
  Filled 2023-12-31: qty 2

## 2023-12-31 MED ORDER — POTASSIUM CHLORIDE IN NACL 20-0.9 MEQ/L-% IV SOLN
INTRAVENOUS | Status: DC
  Filled 2023-12-31: qty 1000

## 2023-12-31 MED ORDER — FENTANYL CITRATE (PF) 250 MCG/5ML IJ SOLN
INTRAMUSCULAR | Status: DC | PRN
  Administered 2023-12-31: 150 ug via INTRAVENOUS
  Administered 2023-12-31 (×2): 50 ug via INTRAVENOUS

## 2023-12-31 MED ORDER — CHLORHEXIDINE GLUCONATE CLOTH 2 % EX PADS: 6.0000 | MEDICATED_PAD | Freq: Once | CUTANEOUS | Status: DC

## 2023-12-31 MED ORDER — CEFAZOLIN SODIUM-DEXTROSE 2-4 GM/100ML-% IV SOLN
2.0000 g | Freq: Three times a day (TID) | INTRAVENOUS | Status: AC
  Administered 2023-12-31 – 2024-01-01 (×2): 2 g via INTRAVENOUS
  Filled 2023-12-31 (×2): qty 100

## 2023-12-31 MED ORDER — MIDAZOLAM HCL 2 MG/2ML IJ SOLN
INTRAMUSCULAR | Status: AC
  Filled 2023-12-31: qty 2

## 2023-12-31 MED ORDER — CELECOXIB 200 MG PO CAPS
200.0000 mg | ORAL_CAPSULE | Freq: Two times a day (BID) | ORAL | Status: DC
  Administered 2023-12-31 – 2024-01-01 (×2): 200 mg via ORAL
  Filled 2023-12-31 (×2): qty 1

## 2023-12-31 MED ORDER — ACETAMINOPHEN 500 MG PO TABS
1000.0000 mg | ORAL_TABLET | ORAL | Status: AC
  Administered 2023-12-31: 1000 mg via ORAL
  Filled 2023-12-31: qty 2

## 2023-12-31 MED ORDER — SUCCINYLCHOLINE CHLORIDE 200 MG/10ML IV SOSY
PREFILLED_SYRINGE | INTRAVENOUS | Status: AC
Start: 2023-12-31 — End: 2023-12-31
  Filled 2023-12-31: qty 10

## 2023-12-31 MED ORDER — DEXAMETHASONE 4 MG PO TABS
4.0000 mg | ORAL_TABLET | Freq: Four times a day (QID) | ORAL | Status: DC
  Administered 2023-12-31 – 2024-01-01 (×3): 4 mg via ORAL
  Filled 2023-12-31 (×3): qty 1

## 2023-12-31 MED ORDER — PROPOFOL 1000 MG/100ML IV EMUL
INTRAVENOUS | Status: AC
Start: 2023-12-31 — End: 2023-12-31
  Filled 2023-12-31: qty 100

## 2023-12-31 MED ORDER — PROPOFOL 10 MG/ML IV BOLUS
INTRAVENOUS | Status: AC
Start: 2023-12-31 — End: 2023-12-31
  Filled 2023-12-31: qty 20

## 2023-12-31 MED ORDER — BUPIVACAINE HCL (PF) 0.25 % IJ SOLN
INTRAMUSCULAR | Status: AC
  Filled 2023-12-31: qty 30

## 2023-12-31 MED ORDER — BUPIVACAINE HCL (PF) 0.25 % IJ SOLN
INTRAMUSCULAR | Status: DC | PRN
  Administered 2023-12-31: 4 mL

## 2023-12-31 MED ORDER — GLYCOPYRROLATE PF 0.2 MG/ML IJ SOSY
PREFILLED_SYRINGE | INTRAMUSCULAR | Status: AC
  Filled 2023-12-31: qty 2

## 2023-12-31 MED ORDER — KETAMINE HCL 50 MG/5ML IJ SOSY
PREFILLED_SYRINGE | INTRAMUSCULAR | Status: AC
  Filled 2023-12-31: qty 5

## 2023-12-31 MED ORDER — ACETAMINOPHEN 500 MG PO TABS
1000.0000 mg | ORAL_TABLET | Freq: Four times a day (QID) | ORAL | Status: DC
  Administered 2023-12-31 – 2024-01-01 (×3): 1000 mg via ORAL
  Filled 2023-12-31 (×3): qty 2

## 2023-12-31 MED ORDER — MENTHOL 3 MG MT LOZG: 1.0000 | LOZENGE | OROMUCOSAL | Status: DC | PRN

## 2023-12-31 MED ORDER — DEXMEDETOMIDINE HCL IN NACL 80 MCG/20ML IV SOLN
INTRAVENOUS | Status: DC | PRN
  Administered 2023-12-31: 8 ug via INTRAVENOUS
  Administered 2023-12-31: 12 ug via INTRAVENOUS

## 2023-12-31 MED ORDER — PHENYLEPHRINE 80 MCG/ML (10ML) SYRINGE FOR IV PUSH (FOR BLOOD PRESSURE SUPPORT)
PREFILLED_SYRINGE | INTRAVENOUS | Status: AC
  Filled 2023-12-31: qty 10

## 2023-12-31 MED ORDER — FENTANYL CITRATE (PF) 100 MCG/2ML IJ SOLN
INTRAMUSCULAR | Status: AC
  Filled 2023-12-31: qty 2

## 2023-12-31 MED ORDER — OXYCODONE HCL 5 MG PO TABS: 5.0000 mg | ORAL_TABLET | Freq: Once | ORAL | Status: DC | PRN

## 2023-12-31 MED ORDER — ONDANSETRON HCL 4 MG/2ML IJ SOLN
INTRAMUSCULAR | Status: AC
  Filled 2023-12-31: qty 4

## 2023-12-31 MED ORDER — THROMBIN 20000 UNITS EX SOLR
CUTANEOUS | Status: AC
  Filled 2023-12-31: qty 20000

## 2023-12-31 MED ORDER — HYDROMORPHONE HCL 1 MG/ML IJ SOLN
INTRAMUSCULAR | Status: AC
  Filled 2023-12-31: qty 1

## 2023-12-31 MED ORDER — SENNA 8.6 MG PO TABS
1.0000 | ORAL_TABLET | Freq: Two times a day (BID) | ORAL | Status: DC
  Administered 2023-12-31 – 2024-01-01 (×2): 8.6 mg via ORAL
  Filled 2023-12-31 (×2): qty 1

## 2023-12-31 MED ORDER — THROMBIN 20000 UNITS EX SOLR
CUTANEOUS | Status: DC | PRN
  Administered 2023-12-31: 20 mL via TOPICAL

## 2023-12-31 MED ORDER — DEXAMETHASONE SODIUM PHOSPHATE 4 MG/ML IJ SOLN: 4.0000 mg | Freq: Four times a day (QID) | INTRAMUSCULAR | Status: DC

## 2023-12-31 MED ORDER — HYDROMORPHONE HCL 1 MG/ML IJ SOLN
INTRAMUSCULAR | Status: DC | PRN
  Administered 2023-12-31: .5 mg via INTRAVENOUS

## 2023-12-31 MED ORDER — ALBUMIN HUMAN 5 % IV SOLN: INTRAVENOUS | Status: DC | PRN

## 2023-12-31 SURGICAL SUPPLY — 53 items
ALLOGRAFT BONE FIBER KORE 5 (Bone Implant) IMPLANT
BAG COUNTER SPONGE SURGICOUNT (BAG) ×1 IMPLANT
BASKET BONE COLLECTION (BASKET) ×1 IMPLANT
BENZOIN TINCTURE PRP APPL 2/3 (GAUZE/BANDAGES/DRESSINGS) ×1 IMPLANT
BLADE BONE MILL MEDIUM (MISCELLANEOUS) ×1 IMPLANT
BLADE CLIPPER SURG (BLADE) IMPLANT
BUR CARBIDE MATCH 3.0 (BURR) ×1 IMPLANT
CANISTER SUCTION 3000ML PPV (SUCTIONS) ×1 IMPLANT
CLSR STERI-STRIP ANTIMIC 1/2X4 (GAUZE/BANDAGES/DRESSINGS) IMPLANT
CNTNR URN SCR LID CUP LEK RST (MISCELLANEOUS) ×1 IMPLANT
COVER BACK TABLE 60X90IN (DRAPES) ×1 IMPLANT
DERMABOND ADVANCED .7 DNX12 (GAUZE/BANDAGES/DRESSINGS) ×1 IMPLANT
DRAPE C-ARM 42X72 X-RAY (DRAPES) ×2 IMPLANT
DRAPE C-ARMOR (DRAPES) ×1 IMPLANT
DRAPE LAPAROTOMY 100X72X124 (DRAPES) ×1 IMPLANT
DRAPE SURG 17X23 STRL (DRAPES) ×1 IMPLANT
DRSG AQUACEL AG ADV 3.5X 6 (GAUZE/BANDAGES/DRESSINGS) ×1 IMPLANT
DRSG OPSITE 4X5.5 SM (GAUZE/BANDAGES/DRESSINGS) IMPLANT
DURAPREP 26ML APPLICATOR (WOUND CARE) ×1 IMPLANT
ELECTRODE REM PT RTRN 9FT ADLT (ELECTROSURGICAL) ×1 IMPLANT
EVACUATOR 1/8 PVC DRAIN (DRAIN) ×1 IMPLANT
GAUZE 4X4 16PLY ~~LOC~~+RFID DBL (SPONGE) IMPLANT
GLOVE BIO SURGEON STRL SZ7 (GLOVE) IMPLANT
GLOVE BIO SURGEON STRL SZ8 (GLOVE) ×2 IMPLANT
GLOVE BIOGEL PI IND STRL 7.0 (GLOVE) IMPLANT
GOWN STRL REUS W/ TWL LRG LVL3 (GOWN DISPOSABLE) IMPLANT
GOWN STRL REUS W/ TWL XL LVL3 (GOWN DISPOSABLE) ×2 IMPLANT
GOWN STRL REUS W/TWL 2XL LVL3 (GOWN DISPOSABLE) IMPLANT
HEMOSTAT POWDER KIT SURGIFOAM (HEMOSTASIS) ×1 IMPLANT
KIT BASIN OR (CUSTOM PROCEDURE TRAY) ×1 IMPLANT
KIT TURNOVER KIT B (KITS) ×1 IMPLANT
MILL BONE PREP (MISCELLANEOUS) ×1 IMPLANT
NDL HYPO 25X1 1.5 SAFETY (NEEDLE) ×1 IMPLANT
NEEDLE HYPO 25X1 1.5 SAFETY (NEEDLE) ×1 IMPLANT
NS IRRIG 1000ML POUR BTL (IV SOLUTION) ×1 IMPLANT
PACK LAMINECTOMY NEURO (CUSTOM PROCEDURE TRAY) ×1 IMPLANT
PAD ARMBOARD POSITIONER FOAM (MISCELLANEOUS) ×3 IMPLANT
POWDER MYRIAD MORCELLS 500MG (Miscellaneous) IMPLANT
ROD LORD LIPPED TI 5.5X45 (Rod) IMPLANT
SCREW CANC SHANK MOD 6.5X45 (Screw) IMPLANT
SCREW POLYAXIAL TULIP (Screw) IMPLANT
SET SCREW SPNE (Screw) IMPLANT
SOLUTION IRRIG SURGIPHOR (IV SOLUTION) ×1 IMPLANT
SPACER IDENTITI PS 10X9X25 15D (Spacer) IMPLANT
SPONGE SURGIFOAM ABS GEL 100 (HEMOSTASIS) ×1 IMPLANT
SPONGE T-LAP 4X18 ~~LOC~~+RFID (SPONGE) IMPLANT
STRIP CLOSURE SKIN 1/2X4 (GAUZE/BANDAGES/DRESSINGS) ×1 IMPLANT
SUT VIC AB 0 CT1 18XCR BRD8 (SUTURE) ×1 IMPLANT
SUT VIC AB 2-0 CP2 18 (SUTURE) ×1 IMPLANT
SUT VIC AB 3-0 SH 8-18 (SUTURE) ×2 IMPLANT
TOWEL GREEN STERILE (TOWEL DISPOSABLE) ×1 IMPLANT
TOWEL GREEN STERILE FF (TOWEL DISPOSABLE) ×1 IMPLANT
WATER STERILE IRR 1000ML POUR (IV SOLUTION) ×1 IMPLANT

## 2023-12-31 NOTE — Anesthesia Procedure Notes (Signed)
 Procedure Name: Intubation Date/Time: 12/31/2023 12:31 PM  Performed by: Mollie Olivia SAUNDERS, CRNAPre-anesthesia Checklist: Patient identified, Emergency Drugs available, Suction available and Patient being monitored Patient Re-evaluated:Patient Re-evaluated prior to induction Oxygen Delivery Method: Circle System Utilized Preoxygenation: Pre-oxygenation with 100% oxygen Induction Type: IV induction Ventilation: Mask ventilation without difficulty Laryngoscope Size: Glidescope and 4 Grade View: Grade I Tube type: Oral Tube size: 7.5 mm Number of attempts: 1 Airway Equipment and Method: Stylet and Oral airway Placement Confirmation: ETT inserted through vocal cords under direct vision, positive ETCO2 and breath sounds checked- equal and bilateral Tube secured with: Tape Dental Injury: Teeth and Oropharynx as per pre-operative assessment

## 2023-12-31 NOTE — Transfer of Care (Signed)
 Immediate Anesthesia Transfer of Care Note  Patient: Manuel Allen  Procedure(s) Performed: POSTERIOR LUMBAR INTERBODY FUSION LUMBAR TWO-THREE (Back)  Patient Location: PACU  Anesthesia Type:General  Level of Consciousness: sedated and drowsy  Airway & Oxygen Therapy: Patient Spontanous Breathing and Patient connected to face mask oxygen  Post-op Assessment: Report given to RN and Post -op Vital signs reviewed and stable  Post vital signs: Reviewed and stable  Last Vitals:  Vitals Value Taken Time  BP 127/72 12/31/23 15:33  Temp 36.9 C 12/31/23 15:33  Pulse 89 12/31/23 15:35  Resp 18 12/31/23 15:35  SpO2 97 % 12/31/23 15:35  Vitals shown include unfiled device data.  Last Pain:  Vitals:   12/31/23 1055  TempSrc:   PainSc: 10-Worst pain ever      Patients Stated Pain Goal: 3 (12/31/23 1043)  Complications: No notable events documented.

## 2023-12-31 NOTE — Anesthesia Preprocedure Evaluation (Signed)
 Anesthesia Evaluation  Patient identified by MRN, date of birth, ID band Patient awake    Reviewed: Allergy & Precautions, H&P , NPO status , Patient's Chart, lab work & pertinent test results  History of Anesthesia Complications (+) PONV and history of anesthetic complications  Airway Mallampati: II   Neck ROM: full    Dental   Pulmonary Patient abstained from smoking., former smoker   breath sounds clear to auscultation       Cardiovascular negative cardio ROS  Rhythm:regular Rate:Normal     Neuro/Psych    GI/Hepatic ,GERD  ,,  Endo/Other    Class 3 obesity  Renal/GU      Musculoskeletal  (+) Arthritis ,    Abdominal   Peds  Hematology   Anesthesia Other Findings   Reproductive/Obstetrics                              Anesthesia Physical Anesthesia Plan  ASA: 2  Anesthesia Plan: General   Post-op Pain Management:    Induction: Intravenous  PONV Risk Score and Plan: 3 and Ondansetron , Dexamethasone , Midazolam  and Treatment may vary due to age or medical condition  Airway Management Planned: Oral ETT  Additional Equipment:   Intra-op Plan:   Post-operative Plan: Extubation in OR  Informed Consent: I have reviewed the patients History and Physical, chart, labs and discussed the procedure including the risks, benefits and alternatives for the proposed anesthesia with the patient or authorized representative who has indicated his/her understanding and acceptance.     Dental advisory given  Plan Discussed with: CRNA, Anesthesiologist and Surgeon  Anesthesia Plan Comments:         Anesthesia Quick Evaluation

## 2023-12-31 NOTE — H&P (Signed)
 Subjective: Patient is a 43 y.o. male admitted for back and leg pain. Onset of symptoms was several months ago, gradually worsening since that time.  The pain is rated severe, and is located at the across the lower back and radiates to legs. The pain is described as aching and occurs all day. The symptoms have been progressive. Symptoms are exacerbated by exercise, standing, and walking for more than a few minutes. 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 above previous successful L3-4 L4-5 L5-S1 fusion  Past Medical History:  Diagnosis Date   Arthritis    Lumbar Back   Chronic lower back pain    Difficulty swallowing    Pt stated I have difficulty swallowing my spit when I lay down at night   Family history of adverse reaction to anesthesia    Patients grandmother had a difficult time waking up; patients mother was given to much anesthesia during knee surgery on 09/28/14   GERD (gastroesophageal reflux disease)    History of kidney stones    PONV (postoperative nausea and vomiting)     Past Surgical History:  Procedure Laterality Date   ABDOMINAL EXPOSURE N/A 08/23/2015   Procedure: ABDOMINAL EXPOSURE;  Surgeon: Krystal JULIANNA Doing, MD;  Location: [facility];  Service: Vascular;  Laterality: N/A;   ANTERIOR LUMBAR FUSION N/A 08/23/2015   Procedure: Lumbar four-five, Lumbar five-Sacral one ANTERIOR LUMBAR FUSION with anterior plating ;  Surgeon: Alm GORMAN Molt, MD;  Location: [facility];  Service: Neurosurgery;  Laterality: N/A;   BACK SURGERY     FRACTURE SURGERY     LAMINECTOMY WITH POSTERIOR LATERAL ARTHRODESIS LEVEL 2 N/A 06/13/2016   Procedure: Posterior Lateral Fusion - Lumbar four-five,  Lumbar five-sacrum one  segmental instrumentation  Lumbar four-sacrum one;  Surgeon: Alm GORMAN Molt, MD;  Location: [facility] OR;  Service: Neurosurgery;  Laterality: N/A;   ORIF RADIAL FRACTURE Left 1990s   Open treatment internal fixation left forearm radius plate   POSTERIOR LUMBAR FUSION   06/13/2016   WOUND EXPLORATION Left 10/06/2015   Procedure: WOUND EXPLORATION;  Surgeon: Lonni GORMAN Blade, MD;  Location: [facility] OR;  Service: Vascular;  Laterality: Left;    Prior to Admission medications   Medication Sig Start Date End Date Taking? Authorizing Provider  allopurinol  (ZYLOPRIM ) 300 MG tablet Take 300 mg by mouth daily.   Yes [provider]  buPROPion  (ZYBAN ) 150 MG 12 hr tablet Take 150 mg by mouth 2 (two) times daily. 10/08/23  Yes [provider]  Cholecalciferol (VITAMIN D3 PO) Take 1,000 Units by mouth in the morning.   Yes [provider]  oxyCODONE  (ROXICODONE ) 15 MG immediate release tablet Take 15 mg by mouth 5 (five) times daily as needed for pain.   Yes [provider]  pregabalin  (LYRICA ) 100 MG capsule Take 100 mg by mouth 3 (three) times daily. 06/01/22  Yes [provider]   Allergies  Allergen Reactions   Tramadol  Other (See Comments) and Nausea And Vomiting   Codeine Hives    Social History   Tobacco Use   Smoking status: Former    Current packs/day: 0.00    Average packs/day: 1 pack/day for 13.2 years (13.2 ttl pk-yrs)    Types: Cigarettes    Start date: 12/15/2003    Quit date: 02/25/2017    Years since quitting: 6.8   Smokeless tobacco: Current    Types: Chew   Tobacco comments:    06/13/2016 quit chewing ~ 2014  Substance Use Topics   Alcohol use: No    Alcohol/week: 0.0 standard drinks of alcohol    Family History  Problem Relation Age of Onset   Hypertension Mother    Cancer Other    Diabetes Other      Review of Systems  Positive ROS: Negative  All other systems have been reviewed and were otherwise negative with the exception of those mentioned in the HPI and as above.  Objective: Vital signs in last 24 hours: Temp:  [98.3 F (36.8 C)] 98.3 F (36.8 C) (09/17 1027) Pulse Rate:  [83] 83 (09/17 1027) Resp:  [20] 20 (09/17 1027) BP: (146)/(90) 146/90 (09/17 1027) SpO2:  [95 %] 95 %  (09/17 1027) Weight:  [118.8 kg] 118.8 kg (09/17 1027)  General Appearance: Alert, cooperative, no distress, appears stated age Head: Normocephalic, without obvious abnormality, atraumatic Eyes: PERRL, conjunctiva/corneas clear, EOM's intact    Neck: Supple, symmetrical, trachea midline Back: Symmetric, no curvature, ROM normal, no CVA tenderness Lungs:  respirations unlabored Heart: Regular rate and rhythm Abdomen: Soft, non-tender Extremities: Extremities normal, atraumatic, no cyanosis or edema Pulses: 2+ and symmetric all extremities Skin: Skin color, texture, turgor normal, no rashes or lesions  NEUROLOGIC:   Mental status: Alert and oriented x4,  no aphasia, good attention span, fund of knowledge, and memory Motor Exam - grossly normal Sensory Exam - grossly normal Reflexes: 1+ Coordination - grossly normal Gait - grossly normal Balance - grossly normal Cranial Nerves: I: smell Not tested  II: visual acuity  OS: nl    OD: nl  II: visual fields Full to confrontation  II: pupils Equal, round, reactive to light  III,VII: ptosis None  III,IV,VI: extraocular muscles  Full ROM  V: mastication Normal  V: facial light touch sensation  Normal  V,VII: corneal reflex  Present  VII: facial muscle function - upper  Normal  VII: facial muscle function - lower Normal  VIII: hearing Not tested  IX: soft palate elevation  Normal  IX,X: gag reflex Present  XI: trapezius strength  5/5  XI: sternocleidomastoid strength 5/5  XI: neck flexion strength  5/5  XII: tongue strength  Normal    Data Review Lab Results  Component Value Date   WBC 10.2 12/26/2023   HGB 14.8 12/26/2023   HCT 46.0 12/26/2023   MCV 90.4 12/26/2023   PLT 185 12/26/2023   Lab Results  Component Value Date   NA 139 12/26/2023   K 4.1 12/26/2023   CL 105 12/26/2023   CO2 24 12/26/2023   BUN 7 12/26/2023   CREATININE 0.91 12/26/2023   GLUCOSE 91 12/26/2023   Lab Results  Component Value Date   INR  1.0 07/15/2022    Assessment/Plan:  Estimated body mass index is 44.97 kg/m as calculated from the following:   Height as of this encounter: 5' 4 (1.626 m).   Weight as of this encounter: 118.8 kg. Patient admitted for PLIF L2-3. Patient has failed a reasonable attempt at conservative therapy.  I explained the condition and procedure to the patient and answered any questions.  Patient wishes to proceed with procedure as planned. Understands risks/ benefits and typical outcomes of procedure.   Alm GORMAN Molt 12/31/2023 11:44 AM

## 2023-12-31 NOTE — Op Note (Signed)
 12/31/2023  3:37 PM  PATIENT:  Manuel Allen  43 y.o. male  PRE-OPERATIVE DIAGNOSIS:    POST-OPERATIVE DIAGNOSIS:  same  PROCEDURE:   1. Decompressive lumbar laminectomy, hemi facetectomy and foraminotomies L2-3 requiring more work than would be required for a simple exposure of the disk for PLIF in order to adequately decompress the neural elements and address the spinal stenosis 2. Posterior lumbar interbody fusion L2-3 using PTI interbody cages packed with morcellized allograft and autograft  3. Posterior fixation L2-3 using atec cortical pedicle screws.  4. Intertransverse arthrodesis L2-3 using morcellized autograft and allograft. 5. Removal of hardware L3-4  SURGEON:  Alm Molt, MD  ASSISTANTS: Meyran FNP  ANESTHESIA:  General  EBL: 250 ml  Total I/O In: 1850 [I.V.:1500; IV Piggyback:350] Out: 500 [Blood:500]  BLOOD ADMINISTERED:none  DRAINS: none   INDICATION FOR PROCEDURE: This patient presented with back and leg pain. Imaging revealed adjacent level stenosis. The patient tried a reasonable attempt at conservative medical measures without relief. I recommended decompression and instrumented fusion to address the stenosis as well as the segmental  instability.  Patient understood the risks, benefits, and alternatives and potential outcomes and wished to proceed.  PROCEDURE DETAILS:  The patient was brought to the operating room. After induction of generalized endotracheal anesthesia the patient was rolled into the prone position on chest rolls and all pressure points were padded. The patient's lumbar region was cleaned and then prepped with DuraPrep and draped in the usual sterile fashion. Anesthesia was injected and then a dorsal midline incision was made and carried down to the lumbosacral fascia. The fascia was opened and the paraspinous musculature was taken down in a subperiosteal fashion to expose L2-3 as well as the previously placed instrumentation at L3-4.  We  removed the locking caps and remove the rods at L3-4.  The screws had good purchase.    A self-retaining retractor was placed. Intraoperative fluoroscopy confirmed my level, and I started with placement of the L2 cortical pedicle screws. The pedicle screw entry zones were identified utilizing surface landmarks and  AP and lateral fluoroscopy. I scored the cortex with the high-speed drill and then used the hand drill to drill an upward and outward direction into the pedicle. I then tapped line to line. I then placed a 6.5 x 45mm cortical pedicle screw into the pedicles of L2 bilaterally.    I then turned my attention to the decompression and complete lumbar laminectomies, hemi- facetectomies, and foraminotomies were performed at L2-3.  My nurse practitioner was directly involved in the decompression and exposure of the neural elements. the patient had significant spinal stenosis and this required more work than would be required for a simple exposure of the disc for posterior lumbar interbody fusion which would only require a limited laminotomy. Much more generous decompression and generous foraminotomy was undertaken in order to adequately decompress the neural elements and address the patient's leg pain. The yellow ligament was removed to expose the underlying dura and nerve roots, and generous foraminotomies were performed to adequately decompress the neural elements. Both the exiting and traversing nerve roots were decompressed on both sides until a coronary dilator passed easily along the nerve roots. Once the decompression was complete, I turned my attention to the posterior lower lumbar interbody fusion. The epidural venous vasculature was coagulated and cut sharply. Disc space was incised and the initial discectomy was performed with pituitary rongeurs. The disc space was distracted with sequential distractors to a height of 10  mm. We then used a series of scrapers and shavers to prepare the endplates for  fusion. The midline was prepared with Epstein curettes. Once the complete discectomy was finished, we packed an appropriate sized interbody cage with local autograft and morcellized allograft, gently retracted the nerve root, and tapped the cage into position at L2-3.  The midline between the cages was packed with morselized autograft and allograft.    We then decorticated the transverse processes and laid a mixture of morcellized autograft and allograft out over these to perform intertransverse arthrodesis at L2-3. We then placed lordotic rods into the multiaxial screw heads of the pedicle screws and locked these in position with the locking caps and anti-torque device. We then checked our construct with AP and lateral fluoroscopy. Irrigated with copious amounts of 0.5% povidone iodine solution followed by saline solution. Inspected the nerve roots once again to assure adequate decompression, lined to the dura with Gelfoam, placed a medium Hemovac drain through a separate stab incision, and then we closed the muscle and the fascia with 0 Vicryl.  Placed 500 g of myriad morsels into the wound to help with wound healing.  Closed the subcutaneous tissues with 2-0 Vicryl and subcuticular tissues with 3-0 Vicryl. The skin was closed with benzoin and Steri-Strips. Dressing was then applied, the patient was awakened from general anesthesia and transported to the recovery room in stable condition. At the end of the procedure all sponge, needle and instrument counts were correct.   PLAN OF CARE: admit to inpatient  PATIENT DISPOSITION:  PACU - hemodynamically stable.   Delay start of Pharmacological VTE agent (>24hrs) due to surgical blood loss or risk of bleeding:  yes

## 2024-01-01 DIAGNOSIS — M48061 Spinal stenosis, lumbar region without neurogenic claudication: Secondary | ICD-10-CM | POA: Diagnosis not present

## 2024-01-01 MED ORDER — OXYCODONE HCL 15 MG PO TABS: 15.0000 mg | ORAL_TABLET | Freq: Every day | ORAL | 0 refills | Status: AC | PRN

## 2024-01-01 MED ORDER — METHOCARBAMOL 500 MG PO TABS: 500.0000 mg | ORAL_TABLET | Freq: Four times a day (QID) | ORAL | 0 refills | Status: AC | PRN

## 2024-01-01 NOTE — Discharge Summary (Signed)
 Physician Discharge Summary  Patient ID: Manuel Allen MRN: [id number] DOB/AGE: November 16, 1980 43 y.o.  Admit date: 12/31/2023 Discharge date: 01/01/2024  Admission Diagnoses: lumbar stenosis with radiculopathy     Discharge Diagnoses: same   Discharged Condition: good  Hospital Course: The patient was admitted on 12/31/2023 and taken to the operating room where the patient underwent PLIF L2-3. The patient tolerated the procedure well and was taken to the recovery room and then to the floor in stable condition. The hospital course was routine. There were no complications. The wound remained clean dry and intact. Pt had appropriate back soreness. No complaints of leg pain or new N/T/W. The patient remained afebrile with stable vital signs, and tolerated a regular diet. The patient continued to increase activities, and pain was well controlled with oral pain medications.   Consults: None  Significant Diagnostic Studies:  Results for orders placed or performed during the hospital encounter of 12/26/23  Surgical pcr screen   Collection Time: 12/26/23 11:18 AM   Specimen: Nasal Mucosa; Nasal Swab  Result Value Ref Range   MRSA, PCR NEGATIVE NEGATIVE   Staphylococcus aureus NEGATIVE NEGATIVE  Basic metabolic panel per protocol   Collection Time: 12/26/23 11:18 AM  Result Value Ref Range   Sodium 139 135 - 145 mmol/L   Potassium 4.1 3.5 - 5.1 mmol/L   Chloride 105 98 - 111 mmol/L   CO2 24 22 - 32 mmol/L   Glucose, Bld 91 70 - 99 mg/dL   BUN 7 6 - 20 mg/dL   Creatinine, Ser 9.08 0.61 - 1.24 mg/dL   Calcium 9.1 8.9 - 89.6 mg/dL   GFR, Estimated >39 >39 mL/min   Anion gap 10 5 - 15  CBC per protocol   Collection Time: 12/26/23 11:18 AM  Result Value Ref Range   WBC 10.2 4.0 - 10.5 K/uL   RBC 5.09 4.22 - 5.81 MIL/uL   Hemoglobin 14.8 13.0 - 17.0 g/dL   HCT 53.9 60.9 - 47.9 %   MCV 90.4 80.0 - 100.0 fL   MCH 29.1 26.0 - 34.0 pg   MCHC 32.2 30.0 - 36.0 g/dL   RDW 86.3 88.4 - 84.4 %    Platelets 185 150 - 400 K/uL   nRBC 0.0 0.0 - 0.2 %  Type and screen [facility]   Collection Time: 12/26/23 11:30 AM  Result Value Ref Range   ABO/RH(D) O POS    Antibody Screen NEG    Sample Expiration 01/09/2024,2359    Extend sample reason      NO TRANSFUSIONS OR PREGNANCY IN THE PAST 3 MONTHS Performed at [facility], [address], [place], [place] [address]     DG Lumbar Spine 2-3 Views Result Date: 12/31/2023 CLINICAL DATA:  Elective surgery. EXAM: LUMBAR SPINE - 2-3 VIEW COMPARISON:  Preoperative imaging. FINDINGS: Three fluoroscopic spot views of the lumbar spine submitted from the operating room. Previous lower lumbar fusion. New posterior fusion at reported L2-L3 level, levels difficult to delineate due to coned views. Fluoroscopy time 34 seconds. Dose 28.11 mGy. IMPRESSION: Intraoperative fluoroscopy during lumbar fusion. Electronically Signed   By: Andrea Gasman M.D.   On: 12/31/2023 16:00   DG C-Arm 1-60 Min-No Report Result Date: 12/31/2023 Fluoroscopy was utilized by the requesting physician.  No radiographic interpretation.   DG C-Arm 1-60 Min-No Report Result Date: 12/31/2023 Fluoroscopy was utilized by the requesting physician.  No radiographic interpretation.    Antibiotics:  Anti-infectives (From admission, onward)  Start     Dose/Rate Route Frequency Ordered Stop   12/31/23 2000  ceFAZolin  (ANCEF ) IVPB 2g/100 mL premix        2 g 200 mL/hr over 30 Minutes Intravenous Every 8 hours 12/31/23 1657 01/01/24 0420   12/31/23 1030  ceFAZolin  (ANCEF ) IVPB 2g/100 mL premix        2 g 200 mL/hr over 30 Minutes Intravenous On call to O.R. 12/31/23 1016 12/31/23 1237       Discharge Exam: Blood pressure (!) 116/55, pulse 75, temperature 97.9 F (36.6 C), temperature source Oral, resp. rate 18, height 5' 4 (1.626 m), weight 118.8 kg, SpO2 96%. Neurologic: Grossly normal Ambulating and voiding well incision cdi   Discharge  Medications:   Allergies as of 01/01/2024       Reactions   Tramadol  Other (See Comments), Nausea And Vomiting   Codeine Hives        Medication List     TAKE these medications    allopurinol  300 MG tablet Commonly known as: ZYLOPRIM  Take 300 mg by mouth daily.   buPROPion  150 MG 12 hr tablet Commonly known as: ZYBAN  Take 150 mg by mouth 2 (two) times daily.   methocarbamol  500 MG tablet Commonly known as: ROBAXIN  Take 1 tablet (500 mg total) by mouth every 6 (six) hours as needed for muscle spasms.   oxyCODONE  15 MG immediate release tablet Commonly known as: ROXICODONE  Take 1 tablet (15 mg total) by mouth 5 (five) times daily as needed for pain.   pregabalin  100 MG capsule Commonly known as: LYRICA  Take 100 mg by mouth 3 (three) times daily.   VITAMIN D3 PO Take 1,000 Units by mouth in the morning.               Durable Medical Equipment  (From admission, onward)           Start     Ordered   12/31/23 1658  DME Walker rolling  Once       Question:  Patient needs a walker to treat with the following condition  Answer:  S/P lumbar fusion   12/31/23 1657   12/31/23 1658  DME 3 n 1  Once        12/31/23 1657            Disposition: home   Final Dx: PLIF L2-3       Signed: Suzen Lacks Markeita Alicia 01/01/2024, 7:56 AM

## 2024-01-01 NOTE — Evaluation (Signed)
 Occupational Therapy Evaluation Patient Details Name: DEE MADAY MRN: [id number] DOB: 05-Nov-1980 Today's Date: 01/01/2024   History of Present Illness   Breeze Angell is a 43 y.o. male admitted 12/31/23 for planned Decompressive lumbar laminectomy, PLIF L2-3, and removal of hardware from L3-4 due to back and leg pain. MRI/CT showed severe adjacent level stenosis at L2-3 above previous successful L3-4 L4-5 L5-S1 fusion. Onset of symptoms was several months ago, gradually worsening since that time.  PMH includes Arthritis, Chronic lower back pain, Difficulty swallowing, PONV, Anterior lumbar fusion (08/23/2015); Abdominal exposure (08/23/2015); Wound exploration (Left, 10/06/2015); Posterior lumbar fusion (06/13/2016); ORIF radial fracture (Left, 1990s); Fracture surgery; and Laminectomy with posterior lateral arthrodesis level 2 (06/13/2016).     Clinical Impressions Pt typically mod I, has been limited by lower back pain and leg pain recently. Today Pt reports pain as 5/10 and feeling great! Pt was educated in back precautions, provided handout. Reviewed ADL specifically including bed mobility (Pt able to demonstrate) Pt fully dressed when OT came in and able to demonstrate figure 4 access to LB for dressing, standing tolerance for grooming tasks, toilet transfer, educated on rear peri care (Pt able to demonstrate access from the front adhering to precautions), tub transfer simulated in room, ambulation down hallway. Pt education complete. Both he and his step-mother in room verbalize understanding of importance of maintaining back precautions especially for the first two weeks including caring for his beagles which he uses for rabbit hunting. OT will sign off at this time as education complete and no further questions.      If plan is discharge home, recommend the following:   Assist for transportation;Assistance with cooking/housework     Functional Status Assessment   Patient has had a recent  decline in their functional status and demonstrates the ability to make significant improvements in function in a reasonable and predictable amount of time.     Equipment Recommendations   None recommended by OT     Recommendations for Other Services         Precautions/Restrictions   Precautions Precautions: Back Precaution Booklet Issued: Yes (comment) Recall of Precautions/Restrictions: Intact Required Braces or Orthoses: Spinal Brace Spinal Brace: Lumbar corset Restrictions Weight Bearing Restrictions Per Provider Order: No     Mobility Bed Mobility Overal bed mobility: Modified Independent             General bed mobility comments: able to demonstrate log roll and sidelying to sit    Transfers Overall transfer level: Modified independent Equipment used: None               General transfer comment: no LOB no assist      Balance Overall balance assessment: No apparent balance deficits (not formally assessed)                                         ADL either performed or assessed with clinical judgement   ADL Overall ADL's : Modified independent                                       General ADL Comments: Pt able to demonstrate UB and LB dressing (figure 4 access) standing tolerance for grooming (and re-education on compensatory strategies) has long handle sponge for bathing, also able to demonstrate tub  transfer simulated in room stepping over sidelying garbage can     Vision Ability to See in Adequate Light: 0 Adequate Patient Visual Report: No change from baseline       Perception         Praxis         Pertinent Vitals/Pain Pain Assessment Pain Assessment: 0-10 Pain Score: 5  Pain Location: back at surgical site Pain Descriptors / Indicators: Operative site guarding, Sore Pain Intervention(s): Monitored during session, Repositioned     Extremity/Trunk Assessment Upper Extremity  Assessment Upper Extremity Assessment: Overall WFL for tasks assessed   Lower Extremity Assessment Lower Extremity Assessment: Defer to PT evaluation   Cervical / Trunk Assessment Cervical / Trunk Assessment: Back Surgery   Communication Communication Communication: No apparent difficulties   Cognition Arousal: Alert Behavior During Therapy: WFL for tasks assessed/performed Cognition: No apparent impairments             OT - Cognition Comments: at baseline                 Following commands: Intact       Cueing  General Comments   Cueing Techniques: Verbal cues  step-mother in room, very supportive. 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      Prior Functioning/Environment Prior Level of Function : Independent/Modified Independent (bu very painful)               ADLs Comments: familiy assists with iADL    OT Problem List: Decreased activity tolerance;Decreased knowledge of use of DME or AE;Decreased knowledge of precautions;Pain   OT Treatment/Interventions:        OT Goals(Current goals can be found in the care plan section)   Acute Rehab OT Goals Patient Stated Goal: get back to rabbit hunting OT Goal Formulation: With patient Time For Goal Achievement: 01/15/24 Potential to Achieve Goals: Good   OT Frequency:       Co-evaluation              AM-PAC OT 6  Clicks Daily Activity     Outcome Measure Help from another person eating meals?: None Help from another person taking care of personal grooming?: None Help from another person toileting, which includes using toliet, bedpan, or urinal?: None Help from another person bathing (including washing, rinsing, drying)?: None Help from another person to put on and taking off regular upper body clothing?: None Help from another person to put on and taking off regular lower body clothing?: None 6 Click Score: 24   End of Session Equipment Utilized During Treatment:  (back brace in truck) Nurse Communication: Mobility status  Activity Tolerance: Patient tolerated treatment well Patient left: in bed;with family/visitor present (sitting EOB)  OT Visit Diagnosis: Unsteadiness on feet (R26.81);Pain Pain - Right/Left:  (central) Pain - part of body:  (back)                Time: 9190-9178 OT Time Calculation (min): 12  min Charges:  OT General Charges $OT Visit: 1 Visit OT Evaluation $OT Eval Low Complexity: 1 Low  Leita DEL OTR/L Acute Rehabilitation Services Office: [phone]   Leita PARAS [facility] 01/01/2024, 8:36 AM

## 2024-01-01 NOTE — Plan of Care (Signed)

## 2024-01-01 NOTE — Anesthesia Postprocedure Evaluation (Signed)
 Anesthesia Post Note  Patient: Manuel Allen  Procedure(s) Performed: POSTERIOR LUMBAR INTERBODY FUSION LUMBAR TWO-THREE (Back)     Patient location during evaluation: PACU Anesthesia Type: General Level of consciousness: awake and alert Pain management: pain level controlled Vital Signs Assessment: post-procedure vital signs reviewed and stable Respiratory status: spontaneous breathing, nonlabored ventilation, respiratory function stable and patient connected to nasal cannula oxygen Cardiovascular status: blood pressure returned to baseline and stable Postop Assessment: no apparent nausea or vomiting Anesthetic complications: no   No notable events documented.  Last Vitals:  Vitals:   01/01/24 0418 01/01/24 0831  BP: (!) 116/55 132/78  Pulse: 75 78  Resp: 18 18  Temp: 36.6 C 37 C  SpO2: 96% 97%    Last Pain:  Vitals:   01/01/24 0831  TempSrc: Oral  PainSc:                  Shaely Gadberry S

## 2024-01-01 NOTE — Discharge Instructions (Signed)
 Wound Care Keep incision covered and dry until post op day 3. You may remove the Honeycomb dressing on post op day 3. Leave steri-strips on back.  They will fall off by themselves. Do not put any creams, lotions, or ointments on incision. You are fine to shower. Let water run over incision and pat dry.   Activity Walk each and every day, increasing distance each day. No lifting greater than 8 lbs.  No lifting no bending no twisting no driving . 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 If provided with back brace, wear when out of bed.  It is not necessary to wear brace in bed.  Diet Resume your normal diet.   Return to Work Will be discussed at your follow up appointment.  Call Your Doctor If Any of These Occur Redness, drainage, or swelling at the wound.  Temperature greater than 101 degrees. Severe pain not relieved by pain medication. Incision starts to come apart.  Follow Up Appt Call [phone] if you have one or any problem.

## 2024-01-01 NOTE — Progress Notes (Signed)
 Physical Therapy Note  Patient is functioning at a high level of independence post-op. Ambulating in hallway with mild antalgic pattern and wide BOS but no overt LOB, no physical assist or AD required. Reviewed precautions, handout provided, and taught LE exercises. Eager to go home. No acute PT needs at this time. PT is signing-off. Please re-order if there is any significant change in status. Thank you for this referral.  Leontine Roads, PT, DPT [facility]  Rehabilitation Services Physical Therapist Office: [phone] Website: [email or web address]

## 2024-01-01 NOTE — Progress Notes (Signed)
Patient alert and oriented, mae's well, voiding adequate amount of urine, swallowing without difficulty, no c/o pain at time of discharge. Patient discharged home with family. Script and discharged instructions given to patient. Patient and family stated understanding of instructions given. Patient has an appointment with Dr. Jones in 2 weeks ?
